# Patient Record
Sex: Male | Born: 1955
Health system: Southern US, Community
[De-identification: ages and names within clinical notes are randomized; demographics above are authoritative.]

## PROBLEM LIST (undated history)

## (undated) DIAGNOSIS — K859 Acute pancreatitis without necrosis or infection, unspecified: Secondary | ICD-10-CM

## (undated) HISTORY — PX: VASECTOMY: SHX75

---

## 2003-05-13 ENCOUNTER — Encounter: Payer: Self-pay | Admitting: Emergency Medicine

## 2003-05-13 ENCOUNTER — Inpatient Hospital Stay (HOSPITAL_COMMUNITY): Admission: AC | Admit: 2003-05-13 | Discharge: 2003-05-18 | Payer: Self-pay

## 2003-05-17 ENCOUNTER — Encounter: Payer: Self-pay | Admitting: General Surgery

## 2003-06-15 ENCOUNTER — Encounter: Payer: Self-pay | Admitting: Orthopedic Surgery

## 2003-06-15 ENCOUNTER — Ambulatory Visit (HOSPITAL_BASED_OUTPATIENT_CLINIC_OR_DEPARTMENT_OTHER): Admission: RE | Admit: 2003-06-15 | Discharge: 2003-06-15 | Payer: Self-pay | Admitting: Orthopedic Surgery

## 2016-06-06 ENCOUNTER — Emergency Department (HOSPITAL_COMMUNITY)
Admission: EM | Admit: 2016-06-06 | Discharge: 2016-06-06 | Disposition: A | Discharge status: Home / Self Care | Payer: BLUE CROSS/BLUE SHIELD | Attending: Emergency Medicine | Admitting: Emergency Medicine

## 2016-06-06 ENCOUNTER — Encounter (HOSPITAL_COMMUNITY): Payer: Self-pay | Admitting: Emergency Medicine

## 2016-06-06 ENCOUNTER — Ambulatory Visit (INDEPENDENT_AMBULATORY_CARE_PROVIDER_SITE_OTHER): Payer: BLUE CROSS/BLUE SHIELD | Admitting: Physician Assistant

## 2016-06-06 ENCOUNTER — Ambulatory Visit (HOSPITAL_COMMUNITY)
Admission: RE | Admit: 2016-06-06 | Discharge: 2016-06-06 | Disposition: A | Discharge status: Home / Self Care | Payer: BLUE CROSS/BLUE SHIELD | Source: Ambulatory Visit | Attending: Physician Assistant | Admitting: Physician Assistant

## 2016-06-06 VITALS — BP 130/90 | HR 110 | Temp 97.9°F | Resp 17 | Ht 71.0 in | Wt 176.0 lb

## 2016-06-06 DIAGNOSIS — R1011 Right upper quadrant pain: Secondary | ICD-10-CM

## 2016-06-06 DIAGNOSIS — R000 Tachycardia, unspecified: Secondary | ICD-10-CM

## 2016-06-06 DIAGNOSIS — I700 Atherosclerosis of aorta: Secondary | ICD-10-CM

## 2016-06-06 DIAGNOSIS — Z79899 Other long term (current) drug therapy: Secondary | ICD-10-CM | POA: Insufficient documentation

## 2016-06-06 DIAGNOSIS — R1013 Epigastric pain: Secondary | ICD-10-CM

## 2016-06-06 DIAGNOSIS — K5730 Diverticulosis of large intestine without perforation or abscess without bleeding: Secondary | ICD-10-CM

## 2016-06-06 DIAGNOSIS — K859 Acute pancreatitis without necrosis or infection, unspecified: Secondary | ICD-10-CM

## 2016-06-06 DIAGNOSIS — K8500 Idiopathic acute pancreatitis without necrosis or infection: Secondary | ICD-10-CM | POA: Diagnosis not present

## 2016-06-06 LAB — CBC
HCT: 44.3 % (ref 39.0–52.0)
HEMOGLOBIN: 14.9 g/dL (ref 13.0–17.0)
MCH: 30.3 pg (ref 26.0–34.0)
MCHC: 33.6 g/dL (ref 30.0–36.0)
MCV: 90.2 fL (ref 78.0–100.0)
Platelets: 215 10*3/uL (ref 150–400)
RBC: 4.91 MIL/uL (ref 4.22–5.81)
RDW: 12.6 % (ref 11.5–15.5)
WBC: 12.5 10*3/uL — ABNORMAL HIGH (ref 4.0–10.5)

## 2016-06-06 LAB — POCT URINALYSIS DIP (MANUAL ENTRY)
Glucose, UA: NEGATIVE
Leukocytes, UA: NEGATIVE
Nitrite, UA: NEGATIVE
SPEC GRAV UA: 1.02
Urobilinogen, UA: 0.2
pH, UA: 6

## 2016-06-06 LAB — URINALYSIS, ROUTINE W REFLEX MICROSCOPIC
BILIRUBIN URINE: NEGATIVE
GLUCOSE, UA: NEGATIVE mg/dL
Ketones, ur: >80 mg/dL — AB
Leukocytes, UA: NEGATIVE
Nitrite: NEGATIVE
PH: 5 (ref 5.0–8.0)
Protein, ur: 30 mg/dL — AB

## 2016-06-06 LAB — POCT CBC
GRANULOCYTE PERCENT: 73 % (ref 37–80)
HEMATOCRIT: 43.8 % (ref 43.5–53.7)
HEMOGLOBIN: 15 g/dL (ref 14.1–18.1)
Lymph, poc: 2.3 (ref 0.6–3.4)
MCH: 30.3 pg (ref 27–31.2)
MCHC: 34.3 g/dL (ref 31.8–35.4)
MCV: 88.5 fL (ref 80–97)
MID (cbc): 1.1 — AB (ref 0–0.9)
MPV: 8.8 fL (ref 0–99.8)
POC GRANULOCYTE: 9.1 — AB (ref 2–6.9)
POC LYMPH %: 18.5 % (ref 10–50)
POC MID %: 8.5 %M (ref 0–12)
Platelet Count, POC: 175 10*3/uL (ref 142–424)
RBC: 4.94 M/uL (ref 4.69–6.13)
RDW, POC: 12.7 %
WBC: 12.5 10*3/uL — AB (ref 4.6–10.2)

## 2016-06-06 LAB — COMPLETE METABOLIC PANEL WITH GFR
ALT: 16 U/L (ref 9–46)
AST: 16 U/L (ref 10–35)
Albumin: 4.3 g/dL (ref 3.6–5.1)
Alkaline Phosphatase: 49 U/L (ref 40–115)
BUN: 11 mg/dL (ref 7–25)
CALCIUM: 10.1 mg/dL (ref 8.6–10.3)
CHLORIDE: 100 mmol/L (ref 98–110)
CO2: 31 mmol/L (ref 20–31)
CREATININE: 0.85 mg/dL (ref 0.70–1.33)
GFR, Est African American: >89 mL/min (ref >=60)
GFR, Est Non African American: >89 mL/min (ref >=60)
Glucose, Bld: 85 mg/dL (ref 65–99)
Potassium: 4.4 mmol/L (ref 3.5–5.3)
Sodium: 141 mmol/L (ref 135–146)
Total Bilirubin: 0.6 mg/dL (ref 0.2–1.2)
Total Protein: 7.2 g/dL (ref 6.1–8.1)

## 2016-06-06 LAB — POC MICROSCOPIC URINALYSIS (UMFC): MUCUS RE: ABSENT

## 2016-06-06 LAB — URINE MICROSCOPIC-ADD ON

## 2016-06-06 LAB — LIPASE, BLOOD: Lipase: 89 U/L — ABNORMAL HIGH (ref 11–51)

## 2016-06-06 MED ORDER — IOPAMIDOL (ISOVUE-300) INJECTION 61%
100.0000 mL | Freq: Once | INTRAVENOUS | Status: AC | PRN
  Administered 2016-06-06: 100 mL via INTRAVENOUS

## 2016-06-06 MED ORDER — ONDANSETRON HCL 4 MG/2ML IJ SOLN
4.0000 mg | Freq: Once | INTRAMUSCULAR | Status: AC
  Administered 2016-06-06: 4 mg via INTRAVENOUS
  Filled 2016-06-06: qty 2

## 2016-06-06 MED ORDER — IOPAMIDOL (ISOVUE-300) INJECTION 61%
30.0000 mL | Freq: Once | INTRAVENOUS | Status: DC | PRN
  Administered 2016-06-06: 30 mL via ORAL
  Filled 2016-06-06: qty 30

## 2016-06-06 MED ORDER — ONDANSETRON HCL 4 MG PO TABS: 4.0000 mg | ORAL_TABLET | Freq: Four times a day (QID) | ORAL | 0 refills | Status: DC

## 2016-06-06 MED ORDER — SODIUM CHLORIDE 0.9 % IV BOLUS (SEPSIS)
1000.0000 mL | Freq: Once | INTRAVENOUS | Status: AC
  Administered 2016-06-06: 1000 mL via INTRAVENOUS

## 2016-06-06 MED ORDER — HYDROCODONE-ACETAMINOPHEN 5-325 MG PO TABS: 1.0000 | ORAL_TABLET | ORAL | 0 refills | Status: DC | PRN

## 2016-06-06 MED ORDER — MORPHINE SULFATE (PF) 4 MG/ML IV SOLN
4.0000 mg | Freq: Once | INTRAVENOUS | Status: AC
  Administered 2016-06-06: 4 mg via INTRAVENOUS
  Filled 2016-06-06: qty 1

## 2016-06-06 NOTE — Progress Notes (Signed)
[facility] [address], [place] [address] [phone]  By signing my name below, I, Ronald Acevedo, attest that this documentation has been prepared under the direction and in the presence of [name], [place]  Electronically Signed: [name], Medical Scribe. 06/06/16. 11:14 AM.  Date:  06/06/2016   Name:  Ronald Acevedo   DOB:  12-11-1955   MRN:  [id number]  PCP:  No primary care provider on file.   Chief Complaint  Patient presents with   Gastroesophageal Reflux    x3days    History of Present Illness:  Ronald Acevedo is a 60 y.o. male patient who presents to [facility] complaining of epigastric pain with onset 3 days ago. Pt states the pain radiates down to mid-abdomen and describes the pain as someone punching him in his upper abdomen. Pt reports pain from breathing, occasional chills, diaphoresis, loss of appetite, and states he normally weights around 180 lbs. Pt took ibuprofen with relief to his symptoms. Pt states one morning the pain was gone when he woke up after taking ibuprofen the night before, but it came back after he took his morning shower. Pt drinks one beer a week, occasionally gets indigestion, and doesn't exercise often but he stays busy. Pt mentions he gets reflux but his chest pain feels different. Pt works as a Airline pilot and he has annual physicals which include labs, and EKGs which have always been nl; he can not report any elevated triglycerides or high calcium at this time. Pt denies taking chronic medications, or recently doing any strenuous activities. Pt denies fever, nausea, melena, coughing, dyspnea, dizziness, irregular colored stool, numbness, and tingling. Pt denies getting any surgeries, FHX sudden CAD, PMHx of aneurysms, high triglycerides, or pancreatitis.  Wt Readings from Last 3 Encounters:  06/06/16 176 lb (79.8 kg)    There are no active problems to display for this patient.   No past medical history on  file.  Past Surgical History:  Procedure Laterality Date   VASECTOMY      Social History  Substance Use Topics   Smoking status: Former Smoker   Smokeless tobacco: Not on file   Alcohol use Not on file    Family History  Problem Relation Age of Onset   Cancer Father     Not on File  Medication list has been reviewed and updated.  No current outpatient prescriptions on file prior to visit.   No current facility-administered medications on file prior to visit.    Review of Systems  Constitutional: Positive for chills, diaphoresis and weight loss. Negative for fever.  Respiratory: Negative for cough.   Cardiovascular: Positive for chest pain.       Negative dyspnea  Gastrointestinal: Negative for blood in stool, melena and nausea.  Neurological: Negative for dizziness and tingling.       Negative numbness   Physical Examination: BP 130/90 (BP Location: Right Arm, Patient Position: Sitting, Cuff Size: Normal)    Pulse (!) 110    Temp 97.9 F (36.6 C) (Oral)    Resp 17    Ht 5\' 11"  (1.803 m)    Wt 176 lb (79.8 kg)    SpO2 97%    BMI 24.55 kg/m  Ideal Body Weight: @FLOWAMB [id number]  Physical Exam  Constitutional: He is oriented to person, place, and time. He appears well-developed and well-nourished. No distress.  HENT:  Head: Normocephalic and atraumatic.  Eyes: Conjunctivae and EOM are normal. Pupils are equal, round,  and reactive to light.  Cardiovascular: Regular rhythm.  Tachycardia present.  Exam reveals no gallop and no friction rub.   No murmur heard. Pulses:      Dorsalis pedis pulses are 2+ on the right side, and 2+ on the left side.  Pulmonary/Chest: Effort normal. No respiratory distress.  Abdominal: Soft. Bowel sounds are normal. He exhibits no mass. There is no hepatomegaly. There is tenderness in the right upper quadrant. There is no rigidity.  No gallbladder distension  Neurological: He is alert and oriented to person, place, and time.  Skin:  Skin is warm and dry. Capillary refill takes less than 2 seconds. He is not diaphoretic.  Psychiatric: He has a normal mood and affect. His behavior is normal.   [EKG Reading]: Sinus  Rhythm - WITHIN NORMAL LIMITS  Results for orders placed or performed in visit on 06/06/16  POCT CBC  Result Value Ref Range   WBC 12.5 (A) 4.6 - 10.2 K/uL   Lymph, poc 2.3 0.6 - 3.4   POC LYMPH PERCENT 18.5 10 - 50 %L   MID (cbc) 1.1 (A) 0 - 0.9   POC MID % 8.5 0 - 12 %M   POC Granulocyte 9.1 (A) 2 - 6.9   Granulocyte percent 73.0 37 - 80 %G   RBC 4.94 4.69 - 6.13 M/uL   Hemoglobin 15.0 14.1 - 18.1 g/dL   HCT, POC 43.8 43.5 - 53.7 %   MCV 88.5 80 - 97 fL   MCH, POC 30.3 27 - 31.2 pg   MCHC 34.3 31.8 - 35.4 g/dL   RDW, POC 12.7 %   Platelet Count, POC 175 142 - 424 K/uL   MPV 8.8 0 - 99.8 fL  POCT urinalysis dipstick  Result Value Ref Range   Color, UA yellow yellow   Clarity, UA clear clear   Glucose, UA negative negative   Bilirubin, UA small (A) negative   Ketones, POC UA moderate (40) (A) negative   Spec Grav, UA 1.020    Blood, UA small (A) negative   pH, UA 6.0    Protein Ur, POC =100 (A) negative   Urobilinogen, UA 0.2    Nitrite, UA Negative Negative   Leukocytes, UA Negative Negative  POCT Microscopic Urinalysis ([facility])  Result Value Ref Range   WBC,UR,HPF,POC None None WBC/hpf   RBC,UR,HPF,POC Few (A) None RBC/hpf   Bacteria Few (A) None, Too numerous to count   Mucus Absent Absent   Epithelial Cells, UR Per Microscopy Few (A) None, Too numerous to count cells/hpf   Ct Abdomen Pelvis W Contrast  Result Date: 06/06/2016 CLINICAL DATA:  Mid abdominal pain radiating to the right for 3 days. EXAM: CT ABDOMEN AND PELVIS WITH CONTRAST TECHNIQUE: Multidetector CT imaging of the abdomen and pelvis was performed using the standard protocol following bolus administration of intravenous contrast. CONTRAST:  149mL ISOVUE-300 IOPAMIDOL (ISOVUE-300) INJECTION 61% COMPARISON:  None. FINDINGS:  Lower chest: No acute findings. Hepatobiliary: Liver and gallbladder appear normal. Pancreas: Hazy density/stranding adjacent to the pancreatic head and uncinate process suggesting a localized acute pancreatitis. No pseudocyst formation. No circumscribed mass within the pancreas. No pancreatic ductal dilatation appreciated. Probable mild reactive thickening of the walls of the adjacent duodenum. Spleen: Normal. Adrenals/Urinary Tract: Adrenal glands appear normal. Kidneys appear normal without mass, stone or hydronephrosis. No ureteral or bladder calculi identified. Bladder appears normal. Stomach/Bowel: Bowel is normal in caliber. Scattered diverticulosis noted within the descending and sigmoid colon without evidence of acute diverticulitis.  Appendix is normal. Stomach appears normal. Probable reactive thickening of the walls of the proximal duodenum, as detailed above. Vascular/Lymphatic: Scattered mild atherosclerotic changes of the normal caliber abdominal aorta. No enlarged lymph nodes seen within the abdomen or pelvis. Reproductive: Unremarkable. Other: No free fluid or abscess collection. No free intraperitoneal air. Musculoskeletal: Degenerative changes throughout the thoracolumbar spine, mild to moderate in degree. No acute or suspicious osseous finding. Superficial soft tissues are unremarkable. IMPRESSION: 1. Acute pancreatitis involving the pancreatic head and uncinate process, with associated peripancreatic fluid stranding. No circumscribed fluid collection or pseudocyst. No evidence of pancreatic or peripancreatic hemorrhage. Probable reactive thickening of the walls of the adjacent proximal duodenum. 2. Colonic diverticulosis without evidence of acute diverticulitis. 3. Aortic atherosclerosis. These results will be called to the ordering clinician or representative by the Radiologist Assistant, and communication documented in the PACS or zVision Dashboard. Electronically Signed   By: Franki Cabot  M.D.   On: 06/06/2016 16:05   Assessment and Plan: PASCHAL GIOVANNONI is a 60 y.o. male who is here today for epigastric pain. I am sending him to CT.  Upon review of acute pancreatitis, he will go to the ED escorted by imaging team.  [facility] charge nurse and was advised he would need to be seen as a general patient.    Tachycardia - Plan: POCT CBC, POCT urinalysis dipstick, COMPLETE METABOLIC PANEL WITH GFR, EKG 12-Lead, CT Abdomen Pelvis W Contrast, CT ABDOMEN W CONTRAST, CANCELED: US Abdomen Complete  RUQ abdominal pain - Plan: POCT CBC, POCT urinalysis dipstick, POCT Microscopic Urinalysis ([facility]), COMPLETE METABOLIC PANEL WITH GFR, EKG 12-Lead, CT Abdomen Pelvis W Contrast, CT ABDOMEN W CONTRAST, Lipase, CANCELED: US Abdomen Complete  Abdominal pain, epigastric - Plan: CT Abdomen Pelvis W Contrast, CT ABDOMEN W CONTRAST, Lipase  [name], PA-C [facility] 06/06/2016 11:14 AM  I personally performed the services described in this documentation, which was scribed in my presence. The recorded information has been reviewed and is accurate.

## 2016-06-06 NOTE — ED Triage Notes (Signed)
Per pt, states LUQ pain since Monday-CT today for acute pancreatitis-was evaluated at [facility] sent here for fluids, pain meds

## 2016-06-06 NOTE — ED Provider Notes (Signed)
[facility] DEPT Provider Note   CSN: [id number] Arrival date & time: 06/06/16  1642     History   Chief Complaint Chief Complaint  Patient presents with  . Abdominal Pain    HPI Ronald Acevedo is a 60 y.o. male.  Pt has had epigastric pain since 9/11.  He went to urgent care today who did labs and ordered a CT scan of his abdomen and pelvis.  The pt said that he was told that he has acute pancreatitis and that he needed to come here for fluids.  The pt said that he has not been able to eat much for the past 3 days.  Pt denies any n/v or f/c.      History reviewed. No pertinent past medical history.  There are no active problems to display for this patient.   Past Surgical History:  Procedure Laterality Date  . VASECTOMY         Home Medications    Prior to Admission medications   Medication Sig Start Date End Date Taking? Authorizing Provider  ibuprofen (ADVIL,MOTRIN) 200 MG tablet Take 400 mg by mouth every 6 (six) hours as needed for moderate pain.   Yes Historical Provider, MD  HYDROcodone-acetaminophen (NORCO/VICODIN) 5-325 MG tablet Take 1 tablet by mouth every 4 (four) hours as needed. 06/06/16   Isla Pence, MD  ondansetron (ZOFRAN) 4 MG tablet Take 1 tablet (4 mg total) by mouth every 6 (six) hours. 06/06/16   Isla Pence, MD    Family History Family History  Problem Relation Age of Onset  . Cancer Father     Social History Social History  Substance Use Topics  . Smoking status: Never Smoker  . Smokeless tobacco: Never Used  . Alcohol use Yes   Pt drinks wine and beer only occasionally.  Allergies   Review of patient's allergies indicates no known allergies.   Review of Systems Review of Systems  Gastrointestinal: Positive for abdominal pain.  All other systems reviewed and are negative.    Physical Exam Updated Vital Signs BP 111/90 (BP Location: Left Arm)   Pulse 109   Temp 99.6 F (37.6 C) (Oral)   Resp 20   SpO2 97%     Physical Exam  Constitutional: He is oriented to person, place, and time. He appears well-developed and well-nourished.  HENT:  Head: Normocephalic and atraumatic.  Right Ear: External ear normal.  Left Ear: External ear normal.  Nose: Nose normal.  Mouth/Throat: Oropharynx is clear and moist.  Eyes: Conjunctivae and EOM are normal. Pupils are equal, round, and reactive to light.  Neck: Normal range of motion. Neck supple.  Cardiovascular: Regular rhythm, normal heart sounds and intact distal pulses.  Tachycardia present.   Pulmonary/Chest: Effort normal and breath sounds normal.  Abdominal: Soft. Bowel sounds are normal. There is tenderness in the epigastric area.  Musculoskeletal: Normal range of motion.  Neurological: He is alert and oriented to person, place, and time.  Skin: Skin is warm.  Psychiatric: He has a normal mood and affect. His behavior is normal. Judgment and thought content normal.  Nursing note and vitals reviewed.    ED Treatments / Results  Labs (all labs ordered are listed, but only abnormal results are displayed) Labs Reviewed  LIPASE, BLOOD - Abnormal; Notable for the following:       Result Value   Lipase 89 (*)    All other components within normal limits  CBC - Abnormal; Notable for the following:  WBC 12.5 (*)    All other components within normal limits  URINALYSIS, ROUTINE W REFLEX MICROSCOPIC (NOT AT [facility]) - Abnormal; Notable for the following:    Specific Gravity, Urine >1.046 (*)    Hgb urine dipstick SMALL (*)    Ketones, ur >80 (*)    Protein, ur 30 (*)    All other components within normal limits  URINE MICROSCOPIC-ADD ON - Abnormal; Notable for the following:    Squamous Epithelial / LPF 0-5 (*)    Bacteria, UA RARE (*)    All other components within normal limits    EKG  EKG Interpretation None       Radiology Ct Abdomen Pelvis W Contrast  Result Date: 06/06/2016 CLINICAL DATA:  Mid abdominal pain radiating to the right  for 3 days. EXAM: CT ABDOMEN AND PELVIS WITH CONTRAST TECHNIQUE: Multidetector CT imaging of the abdomen and pelvis was performed using the standard protocol following bolus administration of intravenous contrast. CONTRAST:  142mL ISOVUE-300 IOPAMIDOL (ISOVUE-300) INJECTION 61% COMPARISON:  None. FINDINGS: Lower chest: No acute findings. Hepatobiliary: Liver and gallbladder appear normal. Pancreas: Hazy density/stranding adjacent to the pancreatic head and uncinate process suggesting a localized acute pancreatitis. No pseudocyst formation. No circumscribed mass within the pancreas. No pancreatic ductal dilatation appreciated. Probable mild reactive thickening of the walls of the adjacent duodenum. Spleen: Normal. Adrenals/Urinary Tract: Adrenal glands appear normal. Kidneys appear normal without mass, stone or hydronephrosis. No ureteral or bladder calculi identified. Bladder appears normal. Stomach/Bowel: Bowel is normal in caliber. Scattered diverticulosis noted within the descending and sigmoid colon without evidence of acute diverticulitis. Appendix is normal. Stomach appears normal. Probable reactive thickening of the walls of the proximal duodenum, as detailed above. Vascular/Lymphatic: Scattered mild atherosclerotic changes of the normal caliber abdominal aorta. No enlarged lymph nodes seen within the abdomen or pelvis. Reproductive: Unremarkable. Other: No free fluid or abscess collection. No free intraperitoneal air. Musculoskeletal: Degenerative changes throughout the thoracolumbar spine, mild to moderate in degree. No acute or suspicious osseous finding. Superficial soft tissues are unremarkable. IMPRESSION: 1. Acute pancreatitis involving the pancreatic head and uncinate process, with associated peripancreatic fluid stranding. No circumscribed fluid collection or pseudocyst. No evidence of pancreatic or peripancreatic hemorrhage. Probable reactive thickening of the walls of the adjacent proximal  duodenum. 2. Colonic diverticulosis without evidence of acute diverticulitis. 3. Aortic atherosclerosis. These results will be called to the ordering clinician or representative by the Radiologist Assistant, and communication documented in the PACS or zVision Dashboard. Electronically Signed   By: Franki Cabot M.D.   On: 06/06/2016 16:05    Procedures Procedures (including critical care time)  Medications Ordered in ED Medications  sodium chloride 0.9 % bolus 1,000 mL (1,000 mLs Intravenous New Bag/Given 06/06/16 2136)  morphine 4 MG/ML injection 4 mg (4 mg Intravenous Given 06/06/16 2135)  ondansetron (ZOFRAN) injection 4 mg (4 mg Intravenous Given 06/06/16 2135)     Initial Impression / Assessment and Plan / ED Course  I have reviewed the triage vital signs and the nursing notes.  Pertinent labs & imaging results that were available during my care of the patient were reviewed by me and considered in my medical decision making (see chart for details).  Clinical Course    Pt is feeling much better.  He has no pain.  He knows to return if worse and to f/u with GI.  Final Clinical Impressions(s) / ED Diagnoses   Final diagnoses:  Idiopathic acute pancreatitis, unspecified complication status  New Prescriptions New Prescriptions   HYDROCODONE-ACETAMINOPHEN (NORCO/VICODIN) 5-325 MG TABLET    Take 1 tablet by mouth every 4 (four) hours as needed.   ONDANSETRON (ZOFRAN) 4 MG TABLET    Take 1 tablet (4 mg total) by mouth every 6 (six) hours.     Isla Pence, MD 06/06/16 2249

## 2016-06-06 NOTE — Patient Instructions (Signed)
     IF you received an x-ray today, you will receive an invoice from [facility]. Please contact [facility] at [phone] with questions or concerns regarding your invoice.   IF you received labwork today, you will receive an invoice from [facility]. Please contact [facility] at [phone] with questions or concerns regarding your invoice.   Our billing staff will not be able to assist you with questions regarding bills from these companies.  You will be contacted with the lab results as soon as they are available. The fastest way to get your results is to activate your My Chart account. Instructions are located on the last page of this paperwork. If you have not heard from us regarding the results in 2 weeks, please contact this office.      

## 2016-06-14 ENCOUNTER — Encounter: Payer: Self-pay | Admitting: Gastroenterology

## 2016-07-19 ENCOUNTER — Ambulatory Visit (AMBULATORY_SURGERY_CENTER): Payer: Self-pay

## 2016-07-19 ENCOUNTER — Encounter: Payer: Self-pay | Admitting: Gastroenterology

## 2016-07-19 VITALS — Ht 70.0 in | Wt 179.4 lb

## 2016-07-19 DIAGNOSIS — Z1211 Encounter for screening for malignant neoplasm of colon: Secondary | ICD-10-CM

## 2016-07-19 MED ORDER — SUPREP BOWEL PREP KIT 17.5-3.13-1.6 GM/177ML PO SOLN: 1.0000 | Freq: Once | ORAL | 0 refills | Status: AC

## 2016-07-19 NOTE — Progress Notes (Signed)
No allergies to eggs or soy No past problems with anesthesia No diet meds No home oxygen  Declined emmi 

## 2016-08-02 ENCOUNTER — Encounter: Payer: Self-pay | Admitting: Gastroenterology

## 2016-08-02 ENCOUNTER — Ambulatory Visit (AMBULATORY_SURGERY_CENTER): Payer: BLUE CROSS/BLUE SHIELD | Admitting: Gastroenterology

## 2016-08-02 VITALS — BP 123/79 | HR 71 | Temp 98.7°F | Resp 12 | Ht 70.0 in | Wt 179.0 lb

## 2016-08-02 DIAGNOSIS — Z1211 Encounter for screening for malignant neoplasm of colon: Secondary | ICD-10-CM | POA: Diagnosis not present

## 2016-08-02 DIAGNOSIS — D120 Benign neoplasm of cecum: Secondary | ICD-10-CM

## 2016-08-02 DIAGNOSIS — D123 Benign neoplasm of transverse colon: Secondary | ICD-10-CM | POA: Diagnosis not present

## 2016-08-02 DIAGNOSIS — K621 Rectal polyp: Secondary | ICD-10-CM

## 2016-08-02 DIAGNOSIS — Z1212 Encounter for screening for malignant neoplasm of rectum: Secondary | ICD-10-CM | POA: Diagnosis not present

## 2016-08-02 DIAGNOSIS — D125 Benign neoplasm of sigmoid colon: Secondary | ICD-10-CM

## 2016-08-02 DIAGNOSIS — D128 Benign neoplasm of rectum: Secondary | ICD-10-CM

## 2016-08-02 DIAGNOSIS — D129 Benign neoplasm of anus and anal canal: Secondary | ICD-10-CM

## 2016-08-02 MED ORDER — SODIUM CHLORIDE 0.9 % IV SOLN
500.0000 mL | INTRAVENOUS | Status: AC
Start: 2016-08-02 — End: ?

## 2016-08-02 NOTE — Progress Notes (Signed)
Report to PACU, RN, vss, BBS= Clear.  

## 2016-08-02 NOTE — Progress Notes (Signed)
Dr. Silverio Decamp will have office to schedule repeat colonoscopy at the hospital.

## 2016-08-02 NOTE — Op Note (Addendum)
[facility] Patient Name: Ronald Acevedo Procedure Date: 08/02/2016 1:15 PM MRN: [id number] Endoscopist: Mauri Pole , MD Age: 60 Referring MD:  Date of Birth: 1955-12-10 Gender: Male Account #: [id number] Procedure:                Colonoscopy Indications:              Screening for colorectal malignant neoplasm, This                            is the patient's first colonoscopy Medicines:                Monitored Anesthesia Care Procedure:                Pre-Anesthesia Assessment:                           - Prior to the procedure, a History and Physical                            was performed, and patient medications and                            allergies were reviewed. The patient's tolerance of                            previous anesthesia was also reviewed. The risks                            and benefits of the procedure and the sedation                            options and risks were discussed with the patient.                            All questions were answered, and informed consent                            was obtained. Prior Anticoagulants: The patient has                            taken no previous anticoagulant or antiplatelet                            agents. ASA Grade Assessment: II - A patient with                            mild systemic disease. After reviewing the risks                            and benefits, the patient was deemed in                            satisfactory condition to undergo the procedure.  After obtaining informed consent, the colonoscope                            was passed under direct vision. Throughout the                            procedure, the patient's blood pressure, pulse, and                            oxygen saturations were monitored continuously. The                            Model CF-HQ190L [id number]) scope was introduced                            through the anus and  advanced to the the terminal                            ileum, with identification of the appendiceal                            orifice and IC valve. The colonoscopy was performed                            without difficulty. The patient tolerated the                            procedure well. The quality of the bowel                            preparation was excellent. The terminal ileum,                            ileocecal valve, appendiceal orifice, and rectum                            were photographed. Scope In: 1:18:35 PM Scope Out: 1:56:02 PM Scope Withdrawal Time: 0 hours 34 minutes 5 seconds  Total Procedure Duration: 0 hours 37 minutes 27 seconds  Findings:                 The perianal and digital rectal examinations were                            normal.                           A 2 mm polyp was found in the cecum. The polyp was                            sessile. The polyp was removed with a cold biopsy                            forceps. Resection and retrieval were complete.  A 15 mm polyp was found in the cecum. The polyp was                            granular lateral spreading. Area was successfully                            injected with 10 mL saline with methylene blue for                            a lift polypectomy. The polyp was removed with a                            piecemeal technique using a hot snare. There was                            additional polyp tissue behind the fold visualized                            after removal of proximal polyp tissue, was                            unsuccessful in lift subsequently and further                            resection was not performed. Polyp resection was                            incomplete. The resected tissue was retrieved.                           A 9 mm polyp was found in the transverse colon. The                            polyp was semi-pedunculated. The polyp was  removed                            with a hot snare. Resection and retrieval were                            complete.                           Two sessile polyps were found in the rectum and                            sigmoid colon. The polyps were 2 to 3 mm in size.                            These polyps were removed with a cold snare.                            Resection and retrieval were complete.  A few small-mouthed diverticula were found in the                            sigmoid colon.                           Non-bleeding internal hemorrhoids were found during                            retroflexion. The hemorrhoids were small. Complications:            No immediate complications. Estimated Blood Loss:     Estimated blood loss was minimal. Impression:               - One 2 mm polyp in the cecum, removed with a cold                            biopsy forceps. Resected and retrieved.                           - One 15 mm polyp in the cecum, removed piecemeal                            using a hot snare. Incomplete resection. Resected                            tissue retrieved. Injected.                           - One 9 mm polyp in the transverse colon, removed                            with a hot snare. Resected and retrieved.                           - Two 2 to 3 mm polyps in the rectum and in the                            sigmoid colon, removed with a cold snare. Resected                            and retrieved.                           - Diverticulosis in the sigmoid colon.                           - Non-bleeding internal hemorrhoids. Recommendation:           - Patient has a contact number available for                            emergencies. The signs and symptoms of potential  delayed complications were discussed with the                            patient. Return to normal activities tomorrow.                             Written discharge instructions were provided to the                            patient.                           - Resume previous diet.                           - Continue present medications.                           - Await pathology results.                           - Repeat colonoscopy in 3 months for surveillance                            after piecemeal polypectomy, will need to schedule                            at [facility] with [facility] availablility. Mauri Pole, MD 08/02/2016 2:09:15 PM This report has been signed electronically.

## 2016-08-02 NOTE — Patient Instructions (Signed)
Discharge instructions given. Handouts on polyps,diverticulosis and hemorrhoids. Resume previous medications. Will put recall in for 3 months. YOU HAD AN ENDOSCOPIC PROCEDURE TODAY AT [facility]:   Refer to the procedure report that was given to you for any specific questions about what was found during the examination.  If the procedure report does not answer your questions, please call your gastroenterologist to clarify.  If you requested that your care partner not be given the details of your procedure findings, then the procedure report has been included in a sealed envelope for you to review at your convenience later.  YOU SHOULD EXPECT: Some feelings of bloating in the abdomen. Passage of more gas than usual.  Walking can help get rid of the air that was put into your GI tract during the procedure and reduce the bloating. If you had a lower endoscopy (such as a colonoscopy or flexible sigmoidoscopy) you may notice spotting of blood in your stool or on the toilet paper. If you underwent a bowel prep for your procedure, you may not have a normal bowel movement for a few days.  Please Note:  You might notice some irritation and congestion in your nose or some drainage.  This is from the oxygen used during your procedure.  There is no need for concern and it should clear up in a day or so.  SYMPTOMS TO REPORT IMMEDIATELY:   Following lower endoscopy (colonoscopy or flexible sigmoidoscopy):  Excessive amounts of blood in the stool  Significant tenderness or worsening of abdominal pains  Swelling of the abdomen that is new, acute  Fever of 100F or higher   For urgent or emergent issues, a gastroenterologist can be reached at any hour by calling [phone].   DIET:  We do recommend a small meal at first, but then you may proceed to your regular diet.  Drink plenty of fluids but you should avoid alcoholic beverages for 24 hours.  ACTIVITY:  You should plan to take it  easy for the rest of today and you should NOT DRIVE or use heavy machinery until tomorrow (because of the sedation medicines used during the test).    FOLLOW UP: Our staff will call the number listed on your records the next business day following your procedure to check on you and address any questions or concerns that you may have regarding the information given to you following your procedure. If we do not reach you, we will leave a message.  However, if you are feeling well and you are not experiencing any problems, there is no need to return our call.  We will assume that you have returned to your regular daily activities without incident.  If any biopsies were taken you will be contacted by phone or by letter within the next 1-3 weeks.  Please call us at [phone] if you have not heard about the biopsies in 3 weeks.    SIGNATURES/CONFIDENTIALITY: You and/or your care partner have signed paperwork which will be entered into your electronic medical record.  These signatures attest to the fact that that the information above on your After Visit Summary has been reviewed and is understood.  Full responsibility of the confidentiality of this discharge information lies with you and/or your care-partner.

## 2016-08-05 ENCOUNTER — Telehealth: Payer: Self-pay

## 2016-08-05 NOTE — Telephone Encounter (Signed)
  Follow up Call-  Call back number 08/02/2016  Post procedure Call Back phone  # [phone]  Permission to leave phone message Yes  Some recent data might be hidden     Patient questions:  Do you have a fever, pain , or abdominal swelling? No. Pain Score  0 *  Have you tolerated food without any problems? Yes.    Have you been able to return to your normal activities? Yes.    Do you have any questions about your discharge instructions: Diet   No. Medications  No. Follow up visit  No.  Do you have questions or concerns about your Care? No.  Actions: * If pain score is 4 or above: No action needed, pain <4.

## 2016-08-07 ENCOUNTER — Encounter: Payer: Self-pay | Admitting: Gastroenterology

## 2016-10-02 ENCOUNTER — Encounter: Payer: Self-pay | Admitting: Gastroenterology

## 2017-04-03 ENCOUNTER — Observation Stay (HOSPITAL_COMMUNITY)
Admission: EM | Admit: 2017-04-03 | Discharge: 2017-04-04 | Disposition: A | Discharge status: Home / Self Care | Payer: BLUE CROSS/BLUE SHIELD | Attending: General Surgery | Admitting: General Surgery

## 2017-04-03 ENCOUNTER — Encounter (HOSPITAL_COMMUNITY): Payer: Self-pay | Admitting: Emergency Medicine

## 2017-04-03 DIAGNOSIS — K219 Gastro-esophageal reflux disease without esophagitis: Secondary | ICD-10-CM | POA: Insufficient documentation

## 2017-04-03 DIAGNOSIS — K810 Acute cholecystitis: Secondary | ICD-10-CM | POA: Diagnosis present

## 2017-04-03 DIAGNOSIS — K8010 Calculus of gallbladder with chronic cholecystitis without obstruction: Secondary | ICD-10-CM | POA: Diagnosis not present

## 2017-04-03 DIAGNOSIS — Z791 Long term (current) use of non-steroidal anti-inflammatories (NSAID): Secondary | ICD-10-CM | POA: Insufficient documentation

## 2017-04-03 DIAGNOSIS — Z419 Encounter for procedure for purposes other than remedying health state, unspecified: Secondary | ICD-10-CM

## 2017-04-03 DIAGNOSIS — K819 Cholecystitis, unspecified: Secondary | ICD-10-CM

## 2017-04-03 DIAGNOSIS — Z79899 Other long term (current) drug therapy: Secondary | ICD-10-CM | POA: Insufficient documentation

## 2017-04-03 HISTORY — DX: Acute pancreatitis without necrosis or infection, unspecified: K85.90

## 2017-04-03 LAB — COMPREHENSIVE METABOLIC PANEL
ALBUMIN: 3.9 g/dL (ref 3.5–5.0)
ALK PHOS: 47 U/L (ref 38–126)
ALT: 22 U/L (ref 17–63)
AST: 19 U/L (ref 15–41)
Anion gap: 8 (ref 5–15)
BILIRUBIN TOTAL: 0.5 mg/dL (ref 0.3–1.2)
BUN: 13 mg/dL (ref 6–20)
CALCIUM: 8.9 mg/dL (ref 8.9–10.3)
CO2: 28 mmol/L (ref 22–32)
Chloride: 102 mmol/L (ref 101–111)
Creatinine, Ser: 0.93 mg/dL (ref 0.61–1.24)
GFR calc Af Amer: >60 mL/min (ref >60)
GFR calc non Af Amer: >60 mL/min (ref >60)
GLUCOSE: 119 mg/dL — AB (ref 65–99)
Potassium: 3.8 mmol/L (ref 3.5–5.1)
Sodium: 138 mmol/L (ref 135–145)
TOTAL PROTEIN: 7.3 g/dL (ref 6.5–8.1)

## 2017-04-03 LAB — URINALYSIS, ROUTINE W REFLEX MICROSCOPIC
BILIRUBIN URINE: NEGATIVE
Glucose, UA: NEGATIVE mg/dL
Hgb urine dipstick: NEGATIVE
Ketones, ur: 20 mg/dL — AB
Leukocytes, UA: NEGATIVE
NITRITE: NEGATIVE
Protein, ur: NEGATIVE mg/dL
SPECIFIC GRAVITY, URINE: 1.029 (ref 1.005–1.030)
pH: 5 (ref 5.0–8.0)

## 2017-04-03 LAB — CBC
HEMATOCRIT: 40.7 % (ref 39.0–52.0)
Hemoglobin: 14.1 g/dL (ref 13.0–17.0)
MCH: 30.9 pg (ref 26.0–34.0)
MCHC: 34.6 g/dL (ref 30.0–36.0)
MCV: 89.1 fL (ref 78.0–100.0)
Platelets: 194 10*3/uL (ref 150–400)
RBC: 4.57 MIL/uL (ref 4.22–5.81)
RDW: 12.9 % (ref 11.5–15.5)
WBC: 11.5 10*3/uL — ABNORMAL HIGH (ref 4.0–10.5)

## 2017-04-03 LAB — LIPASE, BLOOD: Lipase: 27 U/L (ref 11–51)

## 2017-04-03 MED ORDER — DEXTROSE 5 % IV SOLN
2.0000 g | INTRAVENOUS | Status: DC
  Administered 2017-04-03: 2 g via INTRAVENOUS
  Filled 2017-04-03 (×2): qty 2

## 2017-04-03 MED ORDER — MORPHINE SULFATE (PF) 2 MG/ML IV SOLN
1.0000 mg | INTRAVENOUS | Status: DC | PRN
  Administered 2017-04-03 – 2017-04-04 (×2): 2 mg via INTRAVENOUS
  Filled 2017-04-03 (×2): qty 1

## 2017-04-03 MED ORDER — ONDANSETRON HCL 4 MG/2ML IJ SOLN
4.0000 mg | Freq: Four times a day (QID) | INTRAMUSCULAR | Status: DC | PRN
  Administered 2017-04-04: 4 mg via INTRAVENOUS

## 2017-04-03 MED ORDER — MORPHINE SULFATE (PF) 4 MG/ML IV SOLN
4.0000 mg | Freq: Once | INTRAVENOUS | Status: AC
  Administered 2017-04-03: 4 mg via INTRAVENOUS
  Filled 2017-04-03: qty 1

## 2017-04-03 MED ORDER — PANTOPRAZOLE SODIUM 40 MG IV SOLR
40.0000 mg | Freq: Every day | INTRAVENOUS | Status: DC
  Administered 2017-04-03: 40 mg via INTRAVENOUS
  Filled 2017-04-03: qty 40

## 2017-04-03 MED ORDER — ONDANSETRON 4 MG PO TBDP: 4.0000 mg | ORAL_TABLET | Freq: Four times a day (QID) | ORAL | Status: DC | PRN

## 2017-04-03 MED ORDER — KCL IN DEXTROSE-NACL 20-5-0.9 MEQ/L-%-% IV SOLN
INTRAVENOUS | Status: DC
  Administered 2017-04-03: 1000 mL via INTRAVENOUS
  Administered 2017-04-04: 09:00:00 via INTRAVENOUS
  Filled 2017-04-03 (×2): qty 1000

## 2017-04-03 NOTE — H&P (Signed)
Ronald Acevedo is an 61 y.o. male.   Chief Complaint: abdominal pain HPI: The patient is a 61 yo wm who presents with upper abdominal pain that started recently. He had a similar pain 6 months ago and was found to have pancreatitis. U/s today shows sludge in gallbladder and mild gb wall thickening  Past Medical History:  Diagnosis Date  . Pancreatitis     Past Surgical History:  Procedure Laterality Date  . VASECTOMY      Family History  Problem Relation Age of Onset  . Cancer Father   . Colon cancer Neg Hx    Social History:  reports that he has never smoked. He has never used smokeless tobacco. He reports that he drinks about 1.2 oz of alcohol per week . He reports that he does not use drugs.  Allergies: No Known Allergies   (Not in a hospital admission)  Results for orders placed or performed during the hospital encounter of 04/03/17 (from the past 48 hour(s))  Lipase, blood     Status: None   Collection Time: 04/03/17  3:43 PM  Result Value Ref Range   Lipase 27 11 - 51 U/L  Comprehensive metabolic panel     Status: Abnormal   Collection Time: 04/03/17  3:43 PM  Result Value Ref Range   Sodium 138 135 - 145 mmol/L   Potassium 3.8 3.5 - 5.1 mmol/L   Chloride 102 101 - 111 mmol/L   CO2 28 22 - 32 mmol/L   Glucose, Bld 119 (H) 65 - 99 mg/dL   BUN 13 6 - 20 mg/dL   Creatinine, Ser 0.93 0.61 - 1.24 mg/dL   Calcium 8.9 8.9 - 10.3 mg/dL   Total Protein 7.3 6.5 - 8.1 g/dL   Albumin 3.9 3.5 - 5.0 g/dL   AST 19 15 - 41 U/L   ALT 22 17 - 63 U/L   Alkaline Phosphatase 47 38 - 126 U/L   Total Bilirubin 0.5 0.3 - 1.2 mg/dL   GFR calc non Af Amer >60 >60 mL/min   GFR calc Af Amer >60 >60 mL/min    Comment: (NOTE) The eGFR has been calculated using the CKD EPI equation. This calculation has not been validated in all clinical situations. eGFR's persistently <60 mL/min signify possible Chronic Kidney Disease.    Anion gap 8 5 - 15  CBC     Status: Abnormal   Collection  Time: 04/03/17  3:43 PM  Result Value Ref Range   WBC 11.5 (H) 4.0 - 10.5 K/uL   RBC 4.57 4.22 - 5.81 MIL/uL   Hemoglobin 14.1 13.0 - 17.0 g/dL   HCT 40.7 39.0 - 52.0 %   MCV 89.1 78.0 - 100.0 fL   MCH 30.9 26.0 - 34.0 pg   MCHC 34.6 30.0 - 36.0 g/dL   RDW 12.9 11.5 - 15.5 %   Platelets 194 150 - 400 K/uL  Urinalysis, Routine w reflex microscopic     Status: Abnormal   Collection Time: 04/03/17  3:44 PM  Result Value Ref Range   Color, Urine YELLOW YELLOW   APPearance HAZY (A) CLEAR   Specific Gravity, Urine 1.029 1.005 - 1.030   pH 5.0 5.0 - 8.0   Glucose, UA NEGATIVE NEGATIVE mg/dL   Hgb urine dipstick NEGATIVE NEGATIVE   Bilirubin Urine NEGATIVE NEGATIVE   Ketones, ur 20 (A) NEGATIVE mg/dL   Protein, ur NEGATIVE NEGATIVE mg/dL   Nitrite NEGATIVE NEGATIVE   Leukocytes, UA NEGATIVE NEGATIVE  No results found.  Review of Systems  Constitutional: Negative.   HENT: Negative.   Eyes: Negative.   Respiratory: Negative.   Cardiovascular: Negative.   Gastrointestinal: Positive for abdominal pain. Negative for nausea and vomiting.  Genitourinary: Negative.   Musculoskeletal: Negative.   Skin: Negative.   Neurological: Negative.   Endo/Heme/Allergies: Negative.   Psychiatric/Behavioral: Negative.     Blood pressure 139/90, pulse 91, temperature 98.3 F (36.8 C), temperature source Oral, resp. rate 18, height '5\' 11"'  (1.803 m), weight 81.6 kg (180 lb), SpO2 99 %. Physical Exam  Constitutional: He is oriented to person, place, and time. He appears well-developed and well-nourished. No distress.  HENT:  Head: Normocephalic and atraumatic.  Mouth/Throat: No oropharyngeal exudate.  Eyes: Pupils are equal, round, and reactive to light. Conjunctivae and EOM are normal.  Neck: Normal range of motion. Neck supple.  Cardiovascular: Normal rate, regular rhythm and normal heart sounds.   Respiratory: Effort normal and breath sounds normal. No stridor. No respiratory distress.  GI:  Soft. Bowel sounds are normal.  There is mild central tenderness  Musculoskeletal: Normal range of motion. He exhibits no edema or tenderness.  Neurological: He is alert and oriented to person, place, and time. Coordination normal.  Skin: Skin is warm and dry. No rash noted.  Psychiatric: He has a normal mood and affect. His behavior is normal. Thought content normal.     Assessment/Plan The patient appears to have cholecystitis with slugde. Because of the risk of another episode of pancreatitis and further pain he may benefit from having gallbladder removed. I will admit and discuss with surgery team in am.  [name], MD 04/03/2017, 8:37 PM

## 2017-04-03 NOTE — ED Triage Notes (Signed)
Pt verbalizes severe right upper abdominal pain; denies n/v/d; had CT completed and MD requesting gallbladder removed.

## 2017-04-03 NOTE — ED Provider Notes (Signed)
[facility] DEPT Provider Note   CSN: [id number] Arrival date & time: 04/03/17  1520     History   Chief Complaint Chief Complaint  Patient presents with  . Abdominal Pain    HPI Ronald Acevedo is a 61 y.o. male.  The history is provided by the patient and medical records.  Abdominal Pain      61 year old male with history of pancreatitis, presenting to the ED with abdominal pain. States this is been ongoing since about Monday evening. States mostly localized to epigastrium and slightly to the right upper abdomen.  States he has had decreased oral intake with nausea but denies vomiting.  Bowels moving normally.  States he had pancreatitis a few months ago and reports this feels similar so he started following plan diet for the past few days but has not had any improvement. States he was seen by his doctor this morning and sent for an ultrasound which showed edematous gallbladder with trace pericholecystic fluid and sludge.  Patient sent here for evaluation by general surgery. Other prior surgeries include vasectomy.  Past Medical History:  Diagnosis Date  . Pancreatitis     There are no active problems to display for this patient.   Past Surgical History:  Procedure Laterality Date  . VASECTOMY         Home Medications    Prior to Admission medications   Medication Sig Start Date End Date Taking? Authorizing Provider  HYDROcodone-acetaminophen (NORCO/VICODIN) 5-325 MG tablet Take 1 tablet by mouth every 4 (four) hours as needed. 06/06/16   Isla Pence, MD  ibuprofen (ADVIL,MOTRIN) 200 MG tablet Take 400 mg by mouth every 6 (six) hours as needed for moderate pain.    [provider]  ondansetron (ZOFRAN) 4 MG tablet Take 1 tablet (4 mg total) by mouth every 6 (six) hours. 06/06/16   Isla Pence, MD    Family History Family History  Problem Relation Age of Onset  . Cancer Father   . Colon cancer Neg Hx     Social History Social History    Substance Use Topics  . Smoking status: Never Smoker  . Smokeless tobacco: Never Used  . Alcohol use 1.2 oz/week    1 Glasses of wine, 1 Cans of beer per week     Allergies   Patient has no known allergies.   Review of Systems Review of Systems  Gastrointestinal: Positive for abdominal pain.  All other systems reviewed and are negative.    Physical Exam Updated Vital Signs BP (!) 140/95 (BP Location: Left Arm)   Pulse 90   Temp 98.2 F (36.8 C) (Oral)   Resp 18   Ht 5\' 11"  (1.803 m)   Wt 81.6 kg (180 lb)   BMI 25.10 kg/m   Physical Exam  Constitutional: He is oriented to person, place, and time. He appears well-developed and well-nourished.  HENT:  Head: Normocephalic and atraumatic.  Mouth/Throat: Oropharynx is clear and moist.  Eyes: Pupils are equal, round, and reactive to light. Conjunctivae and EOM are normal.  Neck: Normal range of motion.  Cardiovascular: Normal rate, regular rhythm and normal heart sounds.   Pulmonary/Chest: Effort normal and breath sounds normal. No respiratory distress. He has no wheezes.  Abdominal: Soft. Bowel sounds are normal. There is tenderness in the epigastric area. There is no rebound.    Musculoskeletal: Normal range of motion.  Neurological: He is alert and oriented to person, place, and time.  Skin: Skin is warm and dry.  Psychiatric: He has a normal mood and affect.  Nursing note and vitals reviewed.    ED Treatments / Results  Labs (all labs ordered are listed, but only abnormal results are displayed) Labs Reviewed  COMPREHENSIVE METABOLIC PANEL - Abnormal; Notable for the following:       Result Value   Glucose, Bld 119 (*)    All other components within normal limits  CBC - Abnormal; Notable for the following:    WBC 11.5 (*)    All other components within normal limits  URINALYSIS, ROUTINE W REFLEX MICROSCOPIC - Abnormal; Notable for the following:    APPearance HAZY (*)    Ketones, ur 20 (*)    All other  components within normal limits  LIPASE, BLOOD    EKG  EKG Interpretation None       Radiology No results found.  Procedures Procedures (including critical care time)  Medications Ordered in ED Medications - No data to display   Initial Impression / Assessment and Plan / ED Course  I have reviewed the triage vital signs and the nursing notes.  Pertinent labs & imaging results that were available during my care of the patient were reviewed by me and considered in my medical decision making (see chart for details).  61 year old male with 3 days of abdominal pain. Underwent ultrasound with PCP this morning which showed edematous gallbladder with pericholecystic fluid and sludge. Was sent here for evaluation by general surgery. He is afebrile and nontoxic. Vitals are stable. Screening lab work overall reassuring. Pain control here after morphine. Will discuss with general surgery.  5:30 PM Spoke with general surgery, Dr. Marlou Starks-- will come and evaluate patient in ED.  General surgery has evaluated patient, will admit for further care and cholecystectomy.  Final Clinical Impressions(s) / ED Diagnoses   Final diagnoses:  Cholecystitis    New Prescriptions New Prescriptions   No medications on file     [name] 04/03/17 2057    Dorie Rank, MD 04/04/17 [phone]

## 2017-04-04 ENCOUNTER — Observation Stay (HOSPITAL_COMMUNITY): Payer: BLUE CROSS/BLUE SHIELD | Admitting: Anesthesiology

## 2017-04-04 ENCOUNTER — Encounter (HOSPITAL_COMMUNITY): Payer: Self-pay | Admitting: General Surgery

## 2017-04-04 ENCOUNTER — Observation Stay (HOSPITAL_COMMUNITY): Payer: BLUE CROSS/BLUE SHIELD

## 2017-04-04 ENCOUNTER — Encounter (HOSPITAL_COMMUNITY)
Admission: EM | Disposition: A | Discharge status: Home / Self Care | Payer: Self-pay | Source: Home / Self Care | Attending: Emergency Medicine

## 2017-04-04 HISTORY — PX: CHOLECYSTECTOMY: SHX55

## 2017-04-04 LAB — HIV ANTIBODY (ROUTINE TESTING W REFLEX): HIV SCREEN 4TH GENERATION: NONREACTIVE

## 2017-04-04 LAB — SURGICAL PCR SCREEN
MRSA, PCR: NEGATIVE
STAPHYLOCOCCUS AUREUS: NEGATIVE

## 2017-04-04 SURGERY — LAPAROSCOPIC CHOLECYSTECTOMY WITH INTRAOPERATIVE CHOLANGIOGRAM
Anesthesia: General | Site: Abdomen

## 2017-04-04 MED ORDER — KCL IN DEXTROSE-NACL 20-5-0.9 MEQ/L-%-% IV SOLN
INTRAVENOUS | Status: DC
  Administered 2017-04-04: 15:00:00 via INTRAVENOUS
  Filled 2017-04-04 (×2): qty 1000

## 2017-04-04 MED ORDER — LACTATED RINGERS IV SOLN
INTRAVENOUS | Status: DC
  Administered 2017-04-04: 11:00:00 via INTRAVENOUS

## 2017-04-04 MED ORDER — HYDROMORPHONE HCL-NACL 0.5-0.9 MG/ML-% IV SOSY
0.2500 mg | PREFILLED_SYRINGE | INTRAVENOUS | Status: DC | PRN
  Administered 2017-04-04 (×3): 0.5 mg via INTRAVENOUS

## 2017-04-04 MED ORDER — LACTATED RINGERS IR SOLN
Status: DC | PRN
  Administered 2017-04-04: 1000 mL

## 2017-04-04 MED ORDER — IOPAMIDOL (ISOVUE-300) INJECTION 61%
INTRAVENOUS | Status: AC
  Filled 2017-04-04: qty 50

## 2017-04-04 MED ORDER — SUGAMMADEX SODIUM 200 MG/2ML IV SOLN
INTRAVENOUS | Status: AC
  Filled 2017-04-04: qty 2

## 2017-04-04 MED ORDER — 0.9 % SODIUM CHLORIDE (POUR BTL) OPTIME
TOPICAL | Status: DC | PRN
  Administered 2017-04-04: 1000 mL

## 2017-04-04 MED ORDER — CHLORHEXIDINE GLUCONATE CLOTH 2 % EX PADS: 6.0000 | MEDICATED_PAD | Freq: Once | CUTANEOUS | Status: DC

## 2017-04-04 MED ORDER — FENTANYL CITRATE (PF) 100 MCG/2ML IJ SOLN
INTRAMUSCULAR | Status: AC
  Filled 2017-04-04: qty 2

## 2017-04-04 MED ORDER — ROCURONIUM BROMIDE 50 MG/5ML IV SOSY
PREFILLED_SYRINGE | INTRAVENOUS | Status: AC
  Filled 2017-04-04: qty 5

## 2017-04-04 MED ORDER — LIDOCAINE 2% (20 MG/ML) 5 ML SYRINGE
INTRAMUSCULAR | Status: DC | PRN
  Administered 2017-04-04: 100 mg via INTRAVENOUS

## 2017-04-04 MED ORDER — SUGAMMADEX SODIUM 200 MG/2ML IV SOLN
INTRAVENOUS | Status: DC | PRN
  Administered 2017-04-04: 200 mg via INTRAVENOUS

## 2017-04-04 MED ORDER — CHLORHEXIDINE GLUCONATE CLOTH 2 % EX PADS
6.0000 | MEDICATED_PAD | Freq: Once | CUTANEOUS | Status: DC
  Administered 2017-04-04: 6 via TOPICAL

## 2017-04-04 MED ORDER — ONDANSETRON HCL 4 MG/2ML IJ SOLN
INTRAMUSCULAR | Status: AC
Start: 2017-04-04 — End: ?
  Filled 2017-04-04: qty 2

## 2017-04-04 MED ORDER — BUPIVACAINE-EPINEPHRINE (PF) 0.25% -1:200000 IJ SOLN
INTRAMUSCULAR | Status: AC
  Filled 2017-04-04: qty 30

## 2017-04-04 MED ORDER — PROPOFOL 10 MG/ML IV BOLUS
INTRAVENOUS | Status: DC | PRN
  Administered 2017-04-04: 200 mg via INTRAVENOUS

## 2017-04-04 MED ORDER — DEXAMETHASONE SODIUM PHOSPHATE 10 MG/ML IJ SOLN
INTRAMUSCULAR | Status: DC | PRN
  Administered 2017-04-04: 10 mg via INTRAVENOUS

## 2017-04-04 MED ORDER — SUCCINYLCHOLINE CHLORIDE 200 MG/10ML IV SOSY
PREFILLED_SYRINGE | INTRAVENOUS | Status: DC | PRN
  Administered 2017-04-04: 120 mg via INTRAVENOUS

## 2017-04-04 MED ORDER — MIDAZOLAM HCL 5 MG/5ML IJ SOLN
INTRAMUSCULAR | Status: DC | PRN
  Administered 2017-04-04: 2 mg via INTRAVENOUS

## 2017-04-04 MED ORDER — LIDOCAINE 2% (20 MG/ML) 5 ML SYRINGE
INTRAMUSCULAR | Status: AC
  Filled 2017-04-04: qty 5

## 2017-04-04 MED ORDER — BUPIVACAINE-EPINEPHRINE 0.25% -1:200000 IJ SOLN
INTRAMUSCULAR | Status: DC | PRN
  Administered 2017-04-04: 30 mL

## 2017-04-04 MED ORDER — SUCCINYLCHOLINE CHLORIDE 200 MG/10ML IV SOSY
PREFILLED_SYRINGE | INTRAVENOUS | Status: AC
  Filled 2017-04-04: qty 10

## 2017-04-04 MED ORDER — HYDROCODONE-ACETAMINOPHEN 5-325 MG PO TABS
1.0000 | ORAL_TABLET | ORAL | Status: DC | PRN
  Administered 2017-04-04: 1 via ORAL
  Filled 2017-04-04: qty 1

## 2017-04-04 MED ORDER — IOPAMIDOL (ISOVUE-300) INJECTION 61%
INTRAVENOUS | Status: DC | PRN
  Administered 2017-04-04: 4.5 mL

## 2017-04-04 MED ORDER — PROPOFOL 10 MG/ML IV BOLUS
INTRAVENOUS | Status: AC
  Filled 2017-04-04: qty 20

## 2017-04-04 MED ORDER — MIDAZOLAM HCL 2 MG/2ML IJ SOLN
INTRAMUSCULAR | Status: AC
  Filled 2017-04-04: qty 2

## 2017-04-04 MED ORDER — HYDROMORPHONE HCL-NACL 0.5-0.9 MG/ML-% IV SOSY
PREFILLED_SYRINGE | INTRAVENOUS | Status: AC
  Filled 2017-04-04: qty 2

## 2017-04-04 MED ORDER — FENTANYL CITRATE (PF) 100 MCG/2ML IJ SOLN
INTRAMUSCULAR | Status: DC | PRN
  Administered 2017-04-04 (×2): 100 ug via INTRAVENOUS

## 2017-04-04 MED ORDER — ROCURONIUM BROMIDE 10 MG/ML (PF) SYRINGE
PREFILLED_SYRINGE | INTRAVENOUS | Status: DC | PRN
  Administered 2017-04-04: 10 mg via INTRAVENOUS
  Administered 2017-04-04: 40 mg via INTRAVENOUS

## 2017-04-04 MED ORDER — PROMETHAZINE HCL 25 MG/ML IJ SOLN: 6.2500 mg | INTRAMUSCULAR | Status: DC | PRN

## 2017-04-04 MED ORDER — DEXAMETHASONE SODIUM PHOSPHATE 10 MG/ML IJ SOLN
INTRAMUSCULAR | Status: AC
  Filled 2017-04-04: qty 1

## 2017-04-04 MED ORDER — HYDROMORPHONE HCL-NACL 0.5-0.9 MG/ML-% IV SOSY
PREFILLED_SYRINGE | INTRAVENOUS | Status: AC
  Filled 2017-04-04: qty 1

## 2017-04-04 SURGICAL SUPPLY — 31 items
APPLIER CLIP 5 13 M/L LIGAMAX5 (MISCELLANEOUS) ×2
CABLE HIGH FREQUENCY MONO STRZ (ELECTRODE) ×2 IMPLANT
CHLORAPREP W/TINT 26ML (MISCELLANEOUS) ×2 IMPLANT
CLIP APPLIE 5 13 M/L LIGAMAX5 (MISCELLANEOUS) ×1 IMPLANT
COVER MAYO STAND STRL (DRAPES) ×2 IMPLANT
DERMABOND ADVANCED (GAUZE/BANDAGES/DRESSINGS) ×1
DERMABOND ADVANCED .7 DNX12 (GAUZE/BANDAGES/DRESSINGS) ×1 IMPLANT
DRAPE C-ARM 42X120 X-RAY (DRAPES) ×2 IMPLANT
ELECT REM PT RETURN 15FT ADLT (MISCELLANEOUS) ×2 IMPLANT
GLOVE BIO SURGEON STRL SZ 6.5 (GLOVE) ×2 IMPLANT
GLOVE BIOGEL PI IND STRL 7.0 (GLOVE) ×1 IMPLANT
GLOVE BIOGEL PI INDICATOR 7.0 (GLOVE) ×1
GOWN STRL REUS W/TWL 2XL LVL3 (GOWN DISPOSABLE) ×2 IMPLANT
GOWN STRL REUS W/TWL XL LVL3 (GOWN DISPOSABLE) ×4 IMPLANT
IRRIG SUCT STRYKERFLOW 2 WTIP (MISCELLANEOUS) ×2
IRRIGATION SUCT STRKRFLW 2 WTP (MISCELLANEOUS) ×1 IMPLANT
IV CATH 14GX2 1/4 (CATHETERS) ×2 IMPLANT
KIT BASIN OR (CUSTOM PROCEDURE TRAY) ×2 IMPLANT
POUCH SPECIMEN RETRIEVAL 10MM (ENDOMECHANICALS) ×2 IMPLANT
SCISSORS LAP 5X35 DISP (ENDOMECHANICALS) ×2 IMPLANT
SET CHOLANGIOGRAPH MIX (MISCELLANEOUS) ×2 IMPLANT
SLEEVE XCEL OPT CAN 5 100 (ENDOMECHANICALS) ×4 IMPLANT
SUT VIC AB 2-0 SH 27 (SUTURE) ×1
SUT VIC AB 2-0 SH 27X BRD (SUTURE) ×1 IMPLANT
SUT VIC AB 4-0 PS2 18 (SUTURE) ×8 IMPLANT
TOWEL OR 17X26 10 PK STRL BLUE (TOWEL DISPOSABLE) ×2 IMPLANT
TOWEL OR NON WOVEN STRL DISP B (DISPOSABLE) ×2 IMPLANT
TRAY LAPAROSCOPIC (CUSTOM PROCEDURE TRAY) ×2 IMPLANT
TROCAR XCEL BLUNT TIP 100MML (ENDOMECHANICALS) ×2 IMPLANT
TROCAR XCEL NON-BLD 5MMX100MML (ENDOMECHANICALS) ×2 IMPLANT
TUBING INSUF HEATED (TUBING) ×2 IMPLANT

## 2017-04-04 NOTE — Progress Notes (Signed)
Pt dc remotely per Dr. Lucia Gaskins. Pt given dc instructions and left via wheelchair accompanied by nurse sec/wife. Pt stable with no concerns.

## 2017-04-04 NOTE — Anesthesia Postprocedure Evaluation (Signed)
Anesthesia Post Note  Patient: Ronald Acevedo  Procedure(s) Performed: Procedure(s) (LRB): LAPAROSCOPIC CHOLECYSTECTOMY WITH INTRAOPERATIVE CHOLANGIOGRAM (N/A)     Patient location during evaluation: PACU Anesthesia Type: General Level of consciousness: awake and alert Pain management: pain level controlled Vital Signs Assessment: post-procedure vital signs reviewed and stable Respiratory status: spontaneous breathing, nonlabored ventilation, respiratory function stable and patient connected to nasal cannula oxygen Cardiovascular status: blood pressure returned to baseline and stable Postop Assessment: no signs of nausea or vomiting Anesthetic complications: no    Last Vitals:  Vitals:   04/04/17 1330 04/04/17 1345  BP: (!) 144/88 (!) 142/90  Pulse: 86 91  Resp: 12 14  Temp: 36.7 C 36.8 C    Last Pain:  Vitals:   04/04/17 1330  TempSrc:   PainSc: 2                  Tiajuana Amass

## 2017-04-04 NOTE — Progress Notes (Signed)
Day of Surgery  Subjective: Patient with RUQ pain, no nausea  Objective: Vital signs in last 24 hours: Temp:  [98.2 F (36.8 C)-99.6 F (37.6 C)] 98.7 F (37.1 C) (07/13 0942) Pulse Rate:  [78-99] 78 (07/13 0942) Resp:  [17-18] 18 (07/13 0644) BP: (129-160)/(85-103) 138/86 (07/13 0942) SpO2:  [96 %-99 %] 99 % (07/13 0942) Weight:  [81.6 kg (180 lb)] 81.6 kg (180 lb) (07/12 1541)   Intake/Output from previous day: 07/12 0701 - 07/13 0700 In: 745 [I.V.:695; IV Piggyback:50] Out: -  Intake/Output this shift: No intake/output data recorded.   General appearance: alert and cooperative GI: normal findings: soft, and tender to palpation in RUQ    Lab Results:   Recent Labs  04/03/17 1543  WBC 11.5*  HGB 14.1  HCT 40.7  PLT 194   BMET  Recent Labs  04/03/17 1543  NA 138  K 3.8  CL 102  CO2 28  GLUCOSE 119*  BUN 13  CREATININE 0.93  CALCIUM 8.9   PT/INR No results for input(s): LABPROT, INR in the last 72 hours. ABG No results for input(s): PHART, HCO3 in the last 72 hours.  Invalid input(s): PCO2, PO2  MEDS, Scheduled . Chlorhexidine Gluconate Cloth  6 each Topical Once   And  . Chlorhexidine Gluconate Cloth  6 each Topical Once  . pantoprazole (PROTONIX) IV  40 mg Intravenous QHS    Studies/Results: No results found.  Assessment: s/p Procedure(s): LAPAROSCOPIC CHOLECYSTECTOMY WITH INTRAOPERATIVE CHOLANGIOGRAM Patient Active Problem List   Diagnosis Date Noted  . Cholecystitis 04/03/2017      Plan: OR today for lap chole, possible IOC for cholecystitis, h/o pancreatitis   The anatomy & physiology of hepatobiliary & pancreatic function was discussed.  The pathophysiology of gallbladder dysfunction was discussed.  Natural history risks without surgery was discussed.   I feel the risks of no intervention will lead to serious problems that outweigh the operative risks; therefore, I recommended cholecystectomy to remove the pathology.  I  explained laparoscopic techniques with possible need for an open approach.  Probable cholangiogram to evaluate the bilary tract was explained as well.    Risks such as bleeding, infection, abscess, leak, injury to other organs, need for further treatment, heart attack, death, and other risks were discussed.  I noted a good likelihood this will help address the problem.  Possibility that this will not correct all abdominal symptoms was explained.  Goals of post-operative recovery were discussed as well.  We will work to minimize complications.  An educational handout further explaining the pathology and treatment options was given as well.  Questions were answered.  The patient expresses understanding & wishes to proceed with surgery.   LOS: 0 days     .Rosario Adie, [facility], [place]   04/04/2017 10:17 AM

## 2017-04-04 NOTE — Progress Notes (Signed)
Called by nurse about patient anxious to go home.  It is about 9:30PM.   I am not in the hospital.  He is doing well, needing no pain meds, taking po's and VS are stable.  He was discharged remotely.  He knows that we cannot call in narcotics.  He'll call our office for follow up with Dr. Marcello Moores in 2 to 3 weeks.  Alphonsa Overall, MD, [facility] Pager: [phone] Office phone:  [phone]

## 2017-04-04 NOTE — Transfer of Care (Signed)
Immediate Anesthesia Transfer of Care Note  Patient: Ronald Acevedo  Procedure(s) Performed: Procedure(s): LAPAROSCOPIC CHOLECYSTECTOMY WITH INTRAOPERATIVE CHOLANGIOGRAM (N/A)  Patient Location: PACU  Anesthesia Type:General  Level of Consciousness: sedated  Airway & Oxygen Therapy: Patient Spontanous Breathing and Patient connected to face mask oxygen  Post-op Assessment: Report given to RN and Post -op Vital signs reviewed and stable  Post vital signs: Reviewed and stable  Last Vitals:  Vitals:   04/04/17 0644 04/04/17 0942  BP: 129/85 138/86  Pulse: 89 78  Resp: 18   Temp: 37.6 C 37.1 C    Last Pain:  Vitals:   04/04/17 0942  TempSrc: Oral  PainSc:       Patients Stated Pain Goal: 1 (93/81/82 9937)  Complications: No apparent anesthesia complications

## 2017-04-04 NOTE — Discharge Instructions (Signed)

## 2017-04-04 NOTE — Op Note (Signed)
04/03/2017 - 04/04/2017  12:25 PM  PATIENT:  Ronald Acevedo  61 y.o. male  Patient Care Team: Burnard Bunting, MD as PCP - General (Internal Medicine)  PRE-OPERATIVE DIAGNOSIS:  acute cholecystitis  POST-OPERATIVE DIAGNOSIS:  acute cholecystitis  PROCEDURE:   LAPAROSCOPIC CHOLECYSTECTOMY WITH INTRAOPERATIVE CHOLANGIOGRAM  SURGEON:  Surgeon(s): Leighton Ruff, MD  ASSISTANT: none   ANESTHESIA:   local and general  EBL:  Total I/O In: 0  Out: 20 [Blood:20]  DRAINS: none   SPECIMEN:  Source of Specimen:  gallbladder  DISPOSITION OF SPECIMEN:  PATHOLOGY  COUNTS:  YES  PLAN OF CARE: Discharge to home after PACU  PATIENT DISPOSITION:  PACU - hemodynamically stable.  INDICATION: 61 y.o. M with cholecystitis and h/o gallstone pancreatitis  The anatomy & physiology of hepatobiliary & pancreatic function was discussed.  The pathophysiology of gallbladder dysfunction was discussed.  Natural history risks without surgery was discussed.   I feel the risks of no intervention will lead to serious problems that outweigh the operative risks; therefore, I recommended cholecystectomy to remove the pathology.  I explained laparoscopic techniques with possible need for an open approach.  Probable cholangiogram to evaluate the bilary tract was explained as well.    Risks such as bleeding, infection, abscess, leak, injury to other organs, need for further treatment, heart attack, death, and other risks were discussed.  I noted a good likelihood this will help address the problem.  Possibility that this will not correct all abdominal symptoms was explained.  Goals of post-operative recovery were discussed as well.    OR FINDINGS: cholecystitis, normal cholangiogram  DESCRIPTION:   The patient was identified & brought into the operating room. The patient was positioned supine with arms tucked. SCDs were active during the entire case. The patient underwent general anesthesia without any  difficulty.  The abdomen was prepped and draped in a sterile fashion. A Surgical Timeout was performed and confirmed our plan.  We positioned the patient in reverse [place] & right side up.  I placed a Hassan laparoscopic port through the umbilicus using open entry technique.  Entry was clean. There were no adhesions to the anterior abdominal wall supraumbilically.  We induced carbon dioxide insufflation. Camera inspection revealed no injury.    I proceeded to continue with laparoscopic technique. I placed a 5 mm port in mid subcostal region, another 48mm port in the right flank near the anterior axillary line, and a 64mm port in the left subxiphoid region obliquely within the falciform ligament.  I turned attention to the right upper quadrant.   The gallbladder fundus was elevated cephalad. I used cautery and blunt dissection to free the peritoneal coverings between the gallbladder and the liver on the posteriolateral and anteriomedial walls.   I used careful blunt and cautery dissection with a maryland dissector to help get a good critical view of the cystic artery and cystic duct. I did further dissection to free a few centimeters of the  gallbladder off the liver bed to get a good critical view of the infundibulum and cystic duct. I mobilized the cystic artery.  I skeletonized the cystic duct.  After getting a good 360 view, I decided to perform a cholangiogram.  I placed a clip on the infundibulum.   I did a partial cystic duct-otomy and ensured patency. I placed a 5 F cholangiocatheter through a puncture site at the right subcostal ridge of the abdominal wall and directed it into the cystic duct.  This was secured with  a clip. We ran a cholangiogram with dilute radio-opaque contrast and continuous fluoroscopy.  Contrast flowed from a side branch consistent with cystic duct cannulization. Contrast flowed up the common hepatic duct into the right and left intrahepatic chains out to secondary radicals.  Contrast flowed down the common bile duct easily across the normal ampulla into the duodenum.  This was consistent with a normal cholangiogram.  I removed the cholangiocatheter.  I placed clips on the cystic duct x3.  I completed cystic duct transection.   I placed clips on the cystic artery x3 with 2 proximally.  I ligated the cystic artery using scissors. I freed the gallbladder from its remaining attachments to the liver. There was a posterior vessel traversing the gallbladder fossa on the lateral side.  This was controlled with clips.  I ensured hemostasis on the gallbladder fossa of the liver and elsewhere. I inspected the rest of the abdomen & detected no injury nor bleeding elsewhere.  I irrigated the RUQ with normal saline.  I removed the gallbladder through the umbilical port site.  I closed the umbilical fascia using 0 Vicryl stitches.   I closed the skin using 4-0 vicryl stitch.  Sterile dressings were applied. The patient was extubated & arrived in the PACU in stable condition.  I had discussed postoperative care with the patient in the holding area.   I will discuss  operative findings and postoperative goals / instructions with the patient's family.  Instructions are written in the chart as well.

## 2017-04-04 NOTE — Anesthesia Preprocedure Evaluation (Addendum)
Anesthesia Evaluation  Patient identified by MRN, date of birth, ID band Patient awake    Reviewed: Allergy & Precautions, NPO status , Patient's Chart, lab work & pertinent test results  Airway Mallampati: II  TM Distance: >3 FB Neck ROM: Full    Dental  (+) Dental Advisory Given   Pulmonary neg pulmonary ROS,    breath sounds clear to auscultation       Cardiovascular negative cardio ROS   Rhythm:Regular Rate:Normal     Neuro/Psych negative neurological ROS     GI/Hepatic Neg liver ROS, GERD  ,Acute cholecystitis   Endo/Other  negative endocrine ROS  Renal/GU negative Renal ROS     Musculoskeletal   Abdominal   Peds  Hematology negative hematology ROS (+)   Anesthesia Other Findings   Reproductive/Obstetrics                            Lab Results  Component Value Date   WBC 11.5 (H) 04/03/2017   HGB 14.1 04/03/2017   HCT 40.7 04/03/2017   MCV 89.1 04/03/2017   PLT 194 04/03/2017   Lab Results  Component Value Date   CREATININE 0.93 04/03/2017   BUN 13 04/03/2017   NA 138 04/03/2017   K 3.8 04/03/2017   CL 102 04/03/2017   CO2 28 04/03/2017    Anesthesia Physical Anesthesia Plan  ASA: II  Anesthesia Plan: General   Post-op Pain Management:    Induction: Intravenous  PONV Risk Score and Plan: 3 and Ondansetron, Dexamethasone, Propofol and Midazolam  Airway Management Planned: Oral ETT  Additional Equipment:   Intra-op Plan:   Post-operative Plan: Extubation in OR  Informed Consent: I have reviewed the patients History and Physical, chart, labs and discussed the procedure including the risks, benefits and alternatives for the proposed anesthesia with the patient or authorized representative who has indicated his/her understanding and acceptance.   Dental advisory given  Plan Discussed with: CRNA  Anesthesia Plan Comments:        Anesthesia Quick  Evaluation

## 2017-04-04 NOTE — Anesthesia Procedure Notes (Signed)
Procedure Name: Intubation Date/Time: 04/04/2017 11:17 AM Performed by: Lind Covert Pre-anesthesia Checklist: Patient identified, Emergency Drugs available, Suction available, Patient being monitored and Timeout performed Patient Re-evaluated:Patient Re-evaluated prior to induction Oxygen Delivery Method: Circle system utilized Preoxygenation: Pre-oxygenation with 100% oxygen Induction Type: IV induction Laryngoscope Size: Mac and 4 Grade View: Grade II Tube type: Oral Tube size: 7.5 mm Number of attempts: 1 Airway Equipment and Method: Stylet Placement Confirmation: ETT inserted through vocal cords under direct vision,  positive ETCO2 and breath sounds checked- equal and bilateral Secured at: 22 cm Tube secured with: Tape Dental Injury: Teeth and Oropharynx as per pre-operative assessment

## 2017-04-07 NOTE — Discharge Summary (Signed)
Patient ID: Ronald Acevedo [id number] 61 y.o. Aug 07, 1956  04/03/2017  Discharge date and time: 04/04/2017 10:20 PM  Admitting Physician: Rosario Adie  Discharge Physician: Alphonsa Overall  Admission Diagnoses: Cholecystitis [K81.9]  Discharge Diagnoses: same  Operations: Procedure(s): LAPAROSCOPIC CHOLECYSTECTOMY WITH INTRAOPERATIVE CHOLANGIOGRAM    Discharged Condition: good    Hospital Course: Pt admitted with cholecystitis.  He underwent lap chole the following AM.  By that evening he felt that his pain was controlled enough for d/c home without narcotics.    Consults: None  Significant Diagnostic Studies: labs:   Treatments: IV hydration and surgery: lap chole  Disposition: Home

## 2018-04-21 DIAGNOSIS — M2141 Flat foot [pes planus] (acquired), right foot: Secondary | ICD-10-CM | POA: Diagnosis not present

## 2018-04-21 DIAGNOSIS — M2142 Flat foot [pes planus] (acquired), left foot: Secondary | ICD-10-CM | POA: Diagnosis not present

## 2018-04-21 DIAGNOSIS — S83241A Other tear of medial meniscus, current injury, right knee, initial encounter: Secondary | ICD-10-CM | POA: Diagnosis not present

## 2018-05-08 ENCOUNTER — Encounter (INDEPENDENT_AMBULATORY_CARE_PROVIDER_SITE_OTHER): Payer: Self-pay

## 2018-05-08 ENCOUNTER — Encounter: Payer: Self-pay | Admitting: Sports Medicine

## 2018-05-08 ENCOUNTER — Ambulatory Visit (INDEPENDENT_AMBULATORY_CARE_PROVIDER_SITE_OTHER): Payer: BLUE CROSS/BLUE SHIELD | Admitting: Sports Medicine

## 2018-05-08 DIAGNOSIS — M216X1 Other acquired deformities of right foot: Secondary | ICD-10-CM | POA: Diagnosis not present

## 2018-05-08 DIAGNOSIS — M25561 Pain in right knee: Secondary | ICD-10-CM | POA: Insufficient documentation

## 2018-05-08 DIAGNOSIS — M216X2 Other acquired deformities of left foot: Secondary | ICD-10-CM

## 2018-05-08 NOTE — Assessment & Plan Note (Signed)
After inspection of the patient's current inserts, they had several holes in them and had very little medial arch support.  I am recommending either a temporary or custom orthotic for the patient.  He would like a custom orthotic made given his high arches. He may come back at any point for modifications if needed. This hopefully assists with low back pain and medial arch pain.  Patient was fitted for a : standard, cushioned, semi-rigid orthotic. The orthotic was heated and afterward the patient stood on the orthotic blank positioned on the orthotic stand. The patient was positioned in subtalar neutral position and 10 degrees of ankle dorsiflexion in a weight bearing stance. After completion of molding, a stable base was applied to the orthotic blank. The blank was ground to a stable position for weight bearing. Size: 9.0 Base: EVA Posting: none Additional orthotic padding: none

## 2018-05-08 NOTE — Assessment & Plan Note (Signed)
-   hx of osteoarthritis per patient, films not available - recent incident of locking with R knee pain after corticosteroid injection one week ago - possible he is having mechanical symptoms from a meniscus tear - if manageable with NSAIDS and activity modification, okay to continue current treatment per Dr. Noemi Chapel - if persistent symptoms, recommend early follow up with Dr. Noemi Chapel with possible MRI

## 2018-05-08 NOTE — Progress Notes (Signed)
CC: High arches  HPI:  Ronald Acevedo is a 62 year old male who presents today as a new patient with a chief complaint of bilateral high arches.  He states that he has worn custom inserts in his shoes for the past 10+ years.  They have assisted him with medial arch pain as well as low back pain.  They are now worn out and have holes in them.  He expresses a desire to have new ones made for him.  When he does walk barefoot, he does have medial arch pain when not supported.  He also used to have low back pain, however this has subsided since wearing custom inserts.  He denies any numbness or tingling of his feet.  Denies any ankle pain.    Another problem that he is having is his right knee is painful.  He had a injected with cortisone last week by Dr. Noemi Chapel.  He has known osteoarthritis of the right knee.  He also has a history of arthroscopy of the right knee.  His knee felt better however over the weekend, he was kneeling and felt like when he went to stand up that his right knee locked on him.  There was no obvious swelling or ecchymoses however it was painful for the next 2 to 3 days.  He then had another incident where he stood up from a seated position and he felt the catching or locking gave way improving his symptoms.  Currently his pain is 3 out of 10 in nature and located on the lateral aspect of the knee.  It is nonradiating.  The pain is currently manageable with 400 mg of ibuprofen.  He denies any numbness or tingling of the extremity.  Denies any hip pain or low back pain.  No trauma that he remembers besides the prior surgery.  Past Injuries: Prior meniscal tear in the right knee Past Medical Hx: Pes cavus, osteoarthritis of the bilateral knees, history of pancreatitis Past Surgeries: Cholecystectomy, vasectomy Smoking: Non-smoker Family Hx: Father had cancer however he is unaware of what type  ROS: Per HPI; no fever, no rash, no weakness, no numbness, no paresthesias, and no  falls/injury.   Objective: BP 108/80   Ht 5\' 10"  (1.778 m)   Wt 182 lb (82.6 kg)   BMI 26.11 kg/m  Gen: NAD, well groomed, a/o x3, normal affect.  CV: Well-perfused. Warm.  Resp: Non-labored. No audible wheezing.  Neuro: Sensation intact throughout. No gross coordination deficits.  Gait: Out-toeing of right foot with ambulation. No signs of limp or balance issues. MSK: Inspection of the feet bilaterally demonstrate a significant pes cavus with large medial arches bilaterally.  No tenderness to palpation in his arches.  He has full range of motion ankle dorsiflexion and plantarflexion.  Strength testing is 5 out of 5 in ankle dorsiflexion and plantarflexion.  The ankles are stable to ligamentous testing.  Negative anterior drawer.  Negative ankle inversion and eversion testing.  Able to rise on his forefoot without any significant problems.  Sensation is intact to light touch L4-S1.  Dorsalis pedis pulse 2 out of 4.  Inspection of the right knee demonstrates no obvious effusion.  He has mild tenderness to palpation over the lateral joint line.  Palpable effusion.  He has full range of motion in knee flexion and extension.  Strength testing 5 out of 5 in knee flexion extension.  The knee is stable to ligamentous testing.  Mild discomfort laterally with rotational movements.  Neurovascularly  intact.  Assessment and Plan:  Acquired bilateral pes cavus After inspection of the patient's current inserts, they had several holes in them and had very little medial arch support.  I am recommending either a temporary or custom orthotic for the patient.  He would like a custom orthotic made given his high arches. He may come back at any point for modifications if needed. This hopefully assists with low back pain and medial arch pain.  Patient was fitted for a : standard, cushioned, semi-rigid orthotic. The orthotic was heated and afterward the patient stood on the orthotic blank positioned on the orthotic  stand. The patient was positioned in subtalar neutral position and 10 degrees of ankle dorsiflexion in a weight bearing stance. After completion of molding, a stable base was applied to the orthotic blank. The blank was ground to a stable position for weight bearing. Size: 9.0 Base: EVA Posting: none Additional orthotic padding: none  Acute pain of right knee - hx of osteoarthritis per patient, films not available - recent incident of locking with R knee pain after corticosteroid injection one week ago - possible he is having mechanical symptoms from a meniscus tear - if manageable with NSAIDS and activity modification, okay to continue current treatment per Dr. Noemi Chapel - if persistent symptoms, recommend early follow up with Dr. Noemi Chapel with possible MRI  Total time spent with the patient was 30 minutes with greater than 50% of the time spent in face-to-face consultation doing gait analysis, construction, and customization of custom orthotics.   Loa Socks, DO [facility] Fellow 05/08/2018 9:52 AM   Patient seen and evaluated with the resident. I agree with the above plan of care. Patient will follow-up with [place] as needed.

## 2018-05-08 NOTE — Patient Instructions (Signed)
Today we made you custom orthotics. Our hope is this helps with your foot, knee, and low back pain. If you feel they are not comfortable or you need tweeking, feel free to call and we can modify these for you. Otherwise, we will see you back as needed.

## 2018-05-21 DIAGNOSIS — S83241D Other tear of medial meniscus, current injury, right knee, subsequent encounter: Secondary | ICD-10-CM | POA: Diagnosis not present

## 2018-05-26 DIAGNOSIS — L821 Other seborrheic keratosis: Secondary | ICD-10-CM | POA: Diagnosis not present

## 2018-05-26 DIAGNOSIS — D225 Melanocytic nevi of trunk: Secondary | ICD-10-CM | POA: Diagnosis not present

## 2018-05-28 DIAGNOSIS — M25561 Pain in right knee: Secondary | ICD-10-CM | POA: Diagnosis not present

## 2018-06-16 DIAGNOSIS — S83281A Other tear of lateral meniscus, current injury, right knee, initial encounter: Secondary | ICD-10-CM | POA: Diagnosis not present

## 2018-06-16 DIAGNOSIS — Y999 Unspecified external cause status: Secondary | ICD-10-CM | POA: Diagnosis not present

## 2018-06-16 DIAGNOSIS — X58XXXA Exposure to other specified factors, initial encounter: Secondary | ICD-10-CM | POA: Diagnosis not present

## 2018-06-16 DIAGNOSIS — M94261 Chondromalacia, right knee: Secondary | ICD-10-CM | POA: Diagnosis not present

## 2018-06-16 DIAGNOSIS — G8918 Other acute postprocedural pain: Secondary | ICD-10-CM | POA: Diagnosis not present

## 2018-06-16 DIAGNOSIS — S83241A Other tear of medial meniscus, current injury, right knee, initial encounter: Secondary | ICD-10-CM | POA: Diagnosis not present

## 2018-06-22 DIAGNOSIS — S83281D Other tear of lateral meniscus, current injury, right knee, subsequent encounter: Secondary | ICD-10-CM | POA: Diagnosis not present

## 2018-06-22 DIAGNOSIS — S83241D Other tear of medial meniscus, current injury, right knee, subsequent encounter: Secondary | ICD-10-CM | POA: Diagnosis not present

## 2018-07-13 DIAGNOSIS — S83281D Other tear of lateral meniscus, current injury, right knee, subsequent encounter: Secondary | ICD-10-CM | POA: Diagnosis not present

## 2018-07-13 DIAGNOSIS — S83241D Other tear of medial meniscus, current injury, right knee, subsequent encounter: Secondary | ICD-10-CM | POA: Diagnosis not present

## 2018-08-11 DIAGNOSIS — S83281D Other tear of lateral meniscus, current injury, right knee, subsequent encounter: Secondary | ICD-10-CM | POA: Diagnosis not present

## 2018-08-11 DIAGNOSIS — S83241D Other tear of medial meniscus, current injury, right knee, subsequent encounter: Secondary | ICD-10-CM | POA: Diagnosis not present

## 2018-11-04 DIAGNOSIS — M25551 Pain in right hip: Secondary | ICD-10-CM | POA: Diagnosis not present

## 2018-11-04 DIAGNOSIS — M545 Low back pain: Secondary | ICD-10-CM | POA: Diagnosis not present

## 2019-04-21 IMAGING — RF DG CHOLANGIOGRAM OPERATIVE
1 series · 4 of 4 positions shown · non-contrast
Comparison: CT 06/06/2016

CLINICAL DATA: Right upper abdominal pain

EXAM:
INTRAOPERATIVE CHOLANGIOGRAM
TECHNIQUE: Cholangiographic images from the C-arm fluoroscopic device were
submitted for interpretation post-operatively. Please see the
procedural report for the amount of contrast and the fluoroscopy
time utilized.

[Series 1: run · 4 of 36 frames shown]
[frame 6/36]
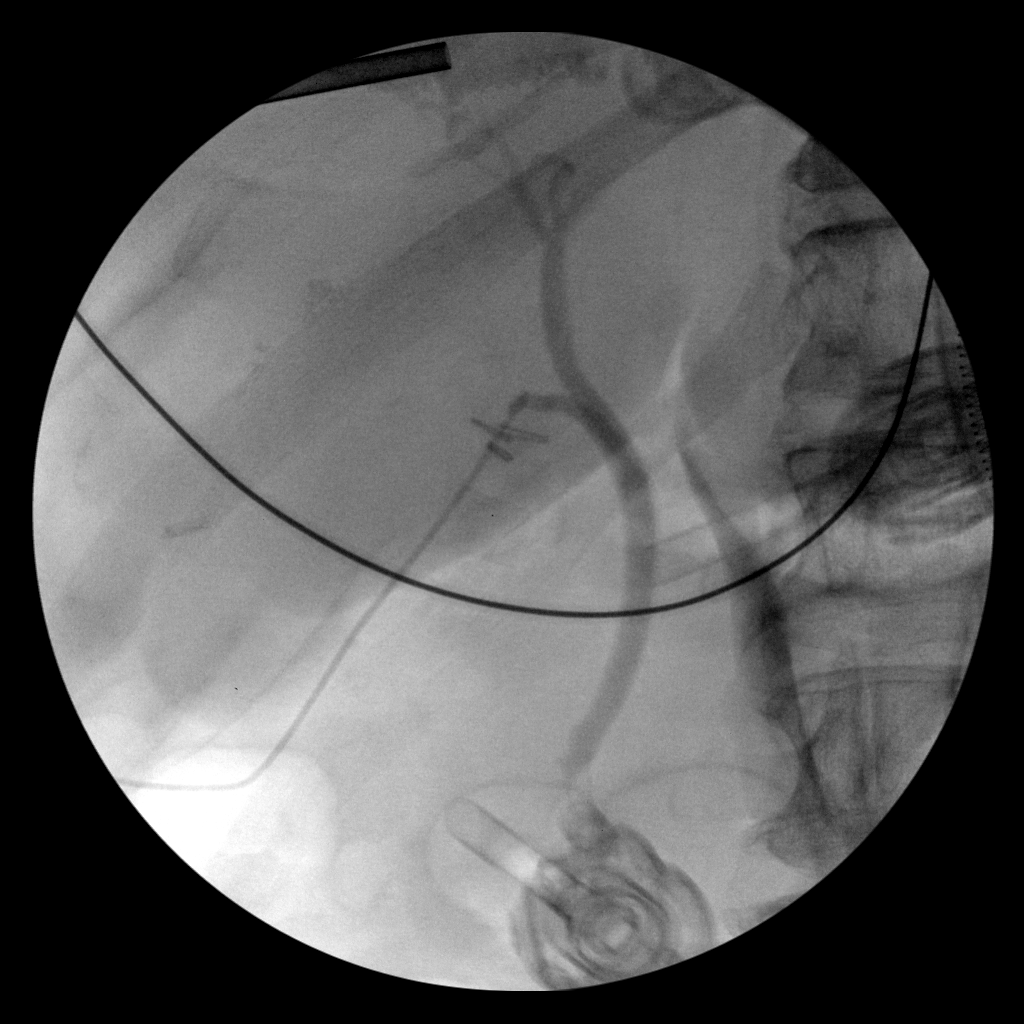
[frame 19/36]
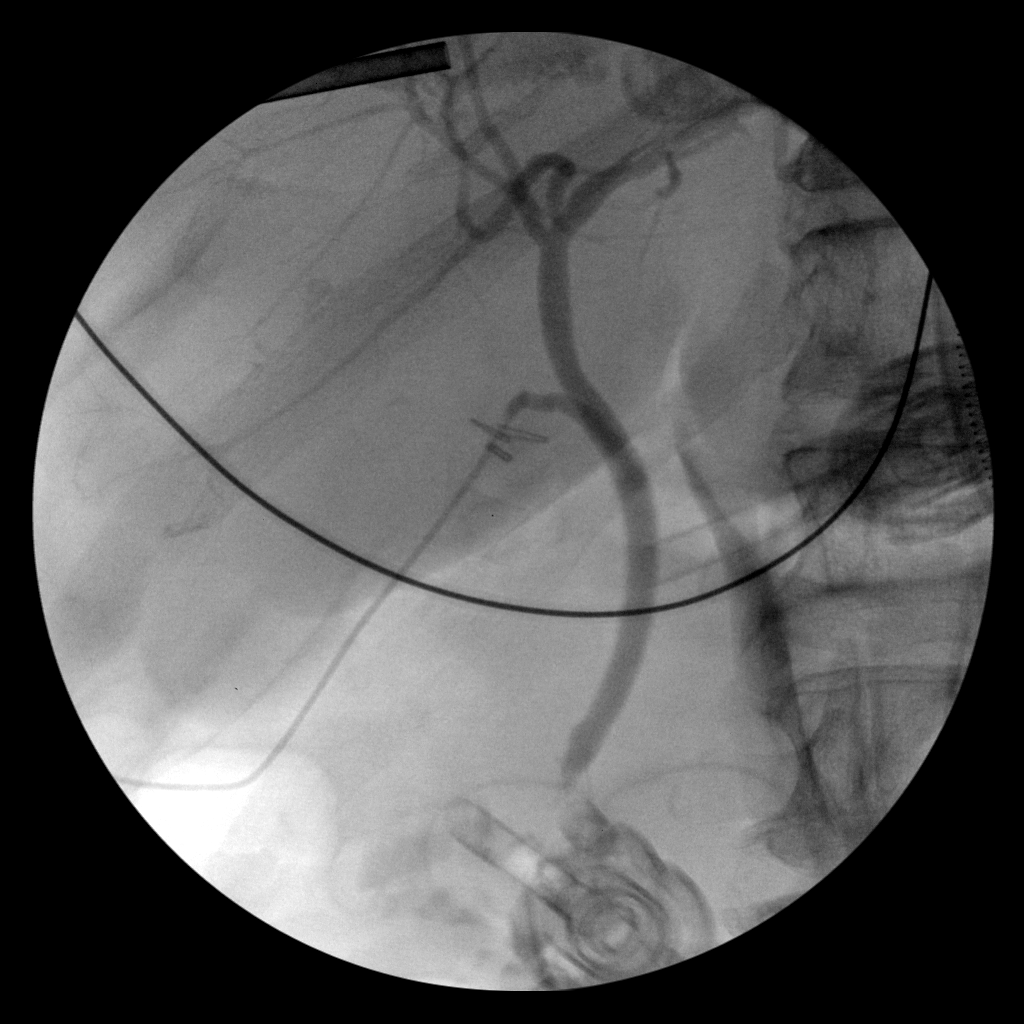
[frame 31/36]
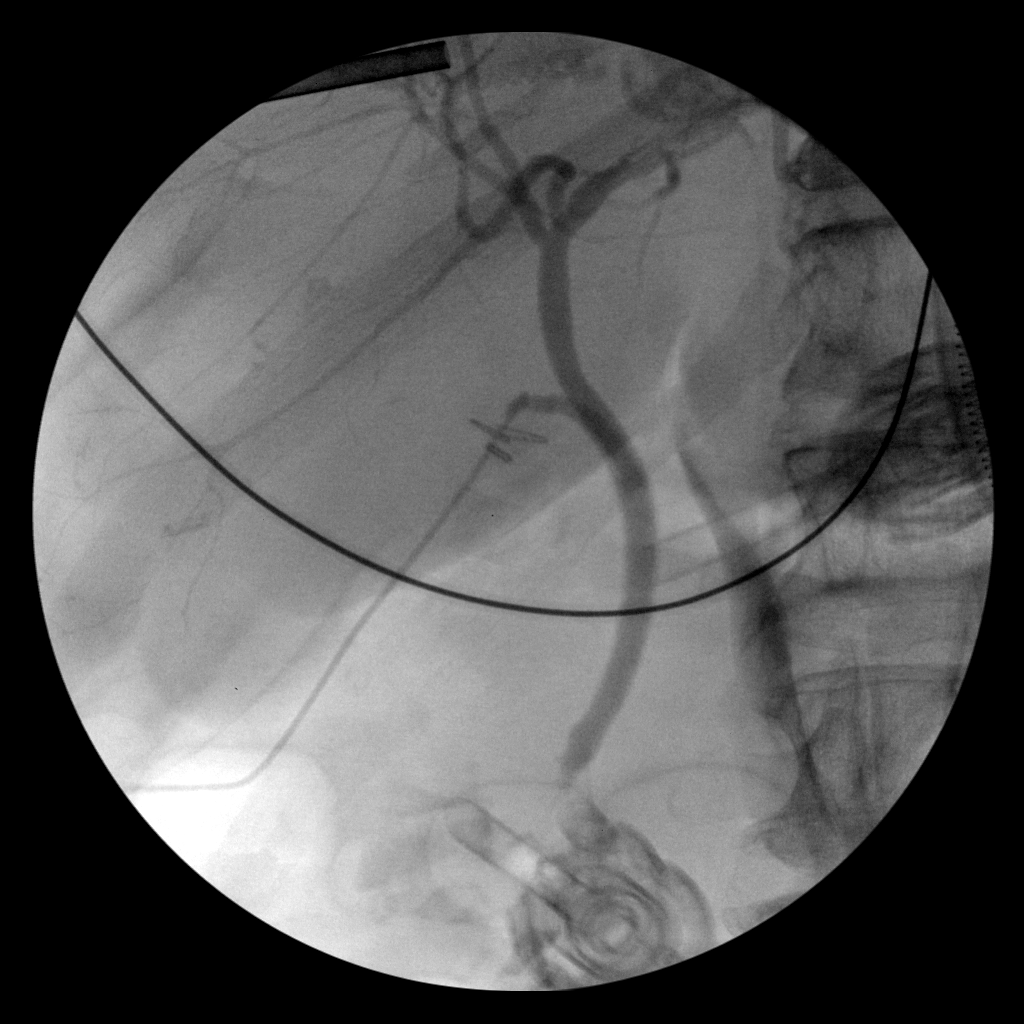
[frame 34/36]
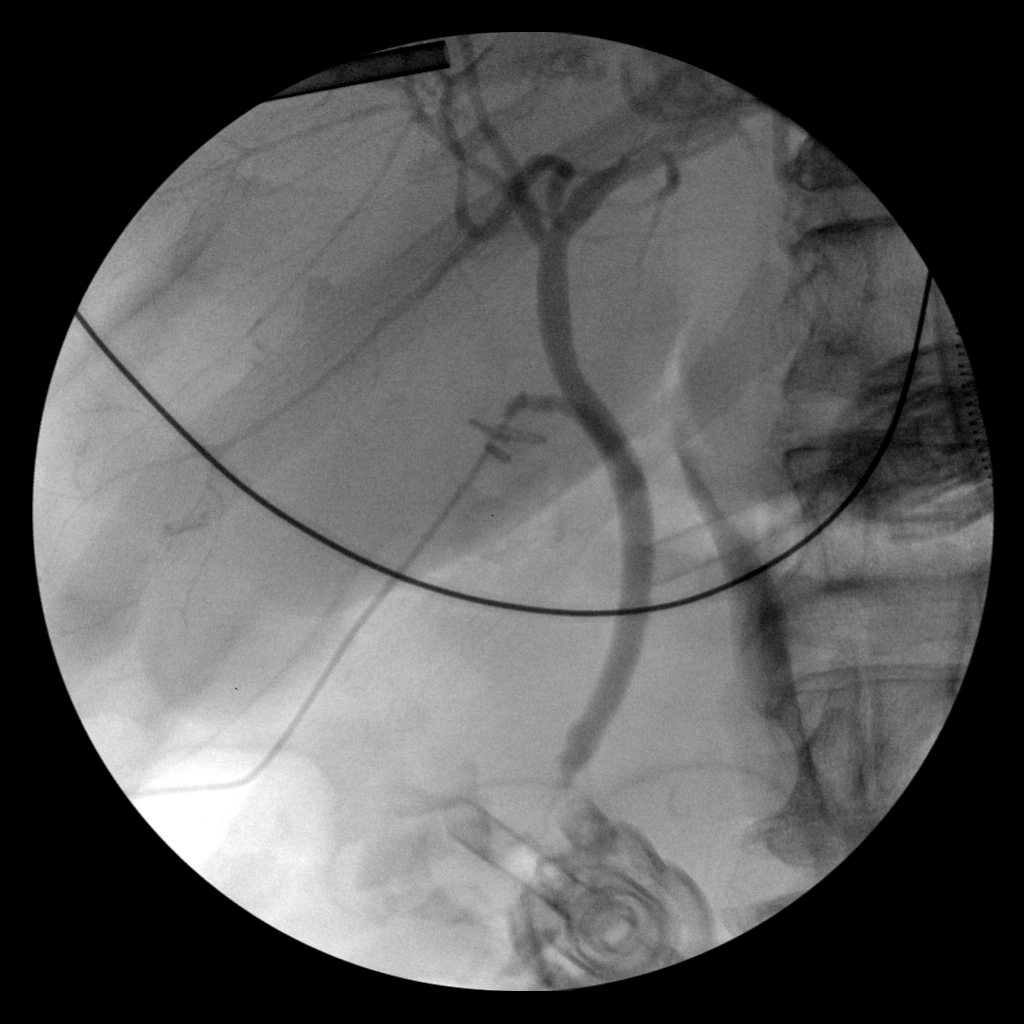

[4 of 4 positions shown; findings below may reference images not displayed]

FINDINGS: No persistent filling defects in the common duct. Intrahepatic ducts
are incompletely visualized, appearing decompressed centrally.
Contrast passes into the duodenum.

:
Negative for retained common duct stone.

## 2019-06-04 ENCOUNTER — Other Ambulatory Visit: Payer: Self-pay | Admitting: *Deleted

## 2019-06-04 DIAGNOSIS — Z20822 Contact with and (suspected) exposure to covid-19: Secondary | ICD-10-CM

## 2019-06-04 NOTE — Progress Notes (Signed)
nov 

## 2019-06-05 LAB — NOVEL CORONAVIRUS, NAA: SARS-CoV-2, NAA: NOT DETECTED

## 2022-12-27 ENCOUNTER — Other Ambulatory Visit: Payer: Self-pay
